# Patient Record
Sex: Male | Born: 1965 | Race: White | Hispanic: No | Marital: Married | Smoking: Never smoker
Health system: Southern US, Community
[De-identification: ages and names within clinical notes are randomized; demographics above are authoritative.]

## PROBLEM LIST (undated history)

## (undated) DIAGNOSIS — G4730 Sleep apnea, unspecified: Secondary | ICD-10-CM

## (undated) DIAGNOSIS — I10 Essential (primary) hypertension: Secondary | ICD-10-CM

## (undated) DIAGNOSIS — M1990 Unspecified osteoarthritis, unspecified site: Secondary | ICD-10-CM

---

## 2003-06-06 HISTORY — PX: BACK SURGERY: SHX140

## 2005-01-13 ENCOUNTER — Ambulatory Visit: Payer: Self-pay | Admitting: Otolaryngology

## 2006-09-17 ENCOUNTER — Emergency Department: Payer: Self-pay | Admitting: General Practice

## 2008-01-19 ENCOUNTER — Emergency Department: Payer: Self-pay | Admitting: Emergency Medicine

## 2010-07-08 ENCOUNTER — Ambulatory Visit: Payer: Self-pay

## 2014-03-16 DIAGNOSIS — E785 Hyperlipidemia, unspecified: Secondary | ICD-10-CM | POA: Insufficient documentation

## 2014-03-16 DIAGNOSIS — G4733 Obstructive sleep apnea (adult) (pediatric): Secondary | ICD-10-CM | POA: Insufficient documentation

## 2014-03-16 DIAGNOSIS — I10 Essential (primary) hypertension: Secondary | ICD-10-CM | POA: Insufficient documentation

## 2015-01-01 ENCOUNTER — Other Ambulatory Visit: Payer: Self-pay | Admitting: Neurosurgery

## 2015-01-01 DIAGNOSIS — M5440 Lumbago with sciatica, unspecified side: Secondary | ICD-10-CM

## 2015-01-13 ENCOUNTER — Other Ambulatory Visit
Admission: AD | Admit: 2015-01-13 | Discharge: 2015-01-13 | Disposition: A | Discharge status: Home / Self Care | Payer: BLUE CROSS/BLUE SHIELD | Source: Ambulatory Visit | Attending: Neurosurgery | Admitting: Neurosurgery

## 2015-01-13 ENCOUNTER — Ambulatory Visit
Admission: RE | Admit: 2015-01-13 | Discharge: 2015-01-13 | Disposition: A | Discharge status: Home / Self Care | Payer: BLUE CROSS/BLUE SHIELD | Source: Ambulatory Visit | Attending: Neurosurgery | Admitting: Neurosurgery

## 2015-01-13 DIAGNOSIS — M5440 Lumbago with sciatica, unspecified side: Secondary | ICD-10-CM | POA: Diagnosis not present

## 2015-01-13 DIAGNOSIS — M4806 Spinal stenosis, lumbar region: Secondary | ICD-10-CM | POA: Insufficient documentation

## 2015-01-13 DIAGNOSIS — M5126 Other intervertebral disc displacement, lumbar region: Secondary | ICD-10-CM | POA: Insufficient documentation

## 2015-01-13 LAB — CREATININE, SERUM
CREATININE: 0.89 mg/dL (ref 0.61–1.24)
GFR calc non Af Amer: >60 mL/min (ref >60)

## 2015-01-13 LAB — BUN: BUN: 13 mg/dL (ref 6–20)

## 2015-01-13 MED ORDER — GADOBENATE DIMEGLUMINE 529 MG/ML IV SOLN
20.0000 mL | Freq: Once | INTRAVENOUS | Status: AC | PRN
  Administered 2015-01-13: 20 mL via INTRAVENOUS

## 2015-06-22 ENCOUNTER — Emergency Department
Admission: EM | Admit: 2015-06-22 | Discharge: 2015-06-22 | Disposition: A | Discharge status: Home / Self Care | Payer: BLUE CROSS/BLUE SHIELD | Attending: Emergency Medicine | Admitting: Emergency Medicine

## 2015-06-22 ENCOUNTER — Encounter: Payer: Self-pay | Admitting: Emergency Medicine

## 2015-06-22 ENCOUNTER — Emergency Department: Payer: BLUE CROSS/BLUE SHIELD

## 2015-06-22 DIAGNOSIS — R0789 Other chest pain: Secondary | ICD-10-CM | POA: Insufficient documentation

## 2015-06-22 DIAGNOSIS — Z79899 Other long term (current) drug therapy: Secondary | ICD-10-CM | POA: Diagnosis not present

## 2015-06-22 DIAGNOSIS — E876 Hypokalemia: Secondary | ICD-10-CM | POA: Diagnosis not present

## 2015-06-22 DIAGNOSIS — R2243 Localized swelling, mass and lump, lower limb, bilateral: Secondary | ICD-10-CM | POA: Insufficient documentation

## 2015-06-22 DIAGNOSIS — R079 Chest pain, unspecified: Secondary | ICD-10-CM | POA: Diagnosis present

## 2015-06-22 DIAGNOSIS — I10 Essential (primary) hypertension: Secondary | ICD-10-CM | POA: Diagnosis not present

## 2015-06-22 HISTORY — DX: Essential (primary) hypertension: I10

## 2015-06-22 LAB — CBC WITH DIFFERENTIAL/PLATELET
Basophils Absolute: 0 10*3/uL (ref 0–0.1)
Basophils Relative: 1 %
Eosinophils Absolute: 0.1 10*3/uL (ref 0–0.7)
Eosinophils Relative: 3 %
HCT: 38.3 % — ABNORMAL LOW (ref 40.0–52.0)
HEMOGLOBIN: 13.2 g/dL (ref 13.0–18.0)
LYMPHS ABS: 1.7 10*3/uL (ref 1.0–3.6)
LYMPHS PCT: 33 %
MCH: 27.8 pg (ref 26.0–34.0)
MCHC: 34.4 g/dL (ref 32.0–36.0)
MCV: 80.8 fL (ref 80.0–100.0)
MONOS PCT: 8 %
Monocytes Absolute: 0.4 10*3/uL (ref 0.2–1.0)
NEUTROS PCT: 55 %
Neutro Abs: 2.9 10*3/uL (ref 1.4–6.5)
Platelets: 216 10*3/uL (ref 150–440)
RBC: 4.73 MIL/uL (ref 4.40–5.90)
RDW: 13.3 % (ref 11.5–14.5)
WBC: 5.1 10*3/uL (ref 3.8–10.6)

## 2015-06-22 LAB — COMPREHENSIVE METABOLIC PANEL
ALK PHOS: 77 U/L (ref 38–126)
ALT: 24 U/L (ref 17–63)
AST: 19 U/L (ref 15–41)
Albumin: 3.8 g/dL (ref 3.5–5.0)
Anion gap: 7 (ref 5–15)
BILIRUBIN TOTAL: 0.6 mg/dL (ref 0.3–1.2)
BUN: 15 mg/dL (ref 6–20)
CALCIUM: 8.2 mg/dL — AB (ref 8.9–10.3)
CO2: 30 mmol/L (ref 22–32)
CREATININE: 0.81 mg/dL (ref 0.61–1.24)
Chloride: 100 mmol/L — ABNORMAL LOW (ref 101–111)
Glucose, Bld: 124 mg/dL — ABNORMAL HIGH (ref 65–99)
Potassium: 2.8 mmol/L — CL (ref 3.5–5.1)
Sodium: 137 mmol/L (ref 135–145)
TOTAL PROTEIN: 6.8 g/dL (ref 6.5–8.1)

## 2015-06-22 LAB — TROPONIN I

## 2015-06-22 MED ORDER — POTASSIUM CHLORIDE ER 10 MEQ PO TBCR: 40.0000 meq | EXTENDED_RELEASE_TABLET | Freq: Every day | ORAL | Status: DC

## 2015-06-22 MED ORDER — DIAZEPAM 5 MG/ML IJ SOLN
5.0000 mg | Freq: Once | INTRAMUSCULAR | Status: AC
  Administered 2015-06-22: 5 mg via INTRAVENOUS
  Filled 2015-06-22: qty 2

## 2015-06-22 MED ORDER — KETOROLAC TROMETHAMINE 30 MG/ML IJ SOLN
30.0000 mg | Freq: Once | INTRAMUSCULAR | Status: AC
  Administered 2015-06-22: 30 mg via INTRAVENOUS
  Filled 2015-06-22: qty 1

## 2015-06-22 MED ORDER — CARISOPRODOL 350 MG PO TABS: 350.0000 mg | ORAL_TABLET | Freq: Three times a day (TID) | ORAL | Status: DC | PRN

## 2015-06-22 MED ORDER — POTASSIUM CHLORIDE CRYS ER 20 MEQ PO TBCR
40.0000 meq | EXTENDED_RELEASE_TABLET | Freq: Once | ORAL | Status: AC
  Administered 2015-06-22: 40 meq via ORAL
  Filled 2015-06-22: qty 2

## 2015-06-22 NOTE — ED Notes (Signed)
Patient ambulatory to triage with steady gait, without difficulty or distress noted; pt reports out of BP meds x week; pt st upper chest pain radiating around into back for last few days; st "I think I pulled a muscle, I do a lot of lifting"; denies any accomp symptoms; st pain increases with movement

## 2015-06-22 NOTE — ED Notes (Signed)
Pt observed lying in bed - MD Schaevitz is assessing him at this time   Report received

## 2015-06-22 NOTE — ED Notes (Signed)
meds administered as ordered   BP elevated   Spoke with him and his spouse about a potassium rich diet -

## 2015-06-22 NOTE — Discharge Instructions (Signed)

## 2015-06-22 NOTE — ED Provider Notes (Addendum)
[facility] Emergency Department Provider Note  ____________________________________________  Time seen: Approximately 7:05 AM  I have reviewed the triage vital signs and the nursing notes.   HISTORY  Chief Complaint No chief complaint on file.    HPI Jack Campbell. is a 50 y.o. male with a history of high blood pressure who is presenting today with chest pain to the mid sternum and right side of his chest for the past week. He says that the pain feels like a "soreness" and worsens when he moves. He says that he has been using ibuprofen with minimal relief. 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 He says that he also has pain just medial to his right scapula that feels like a pinching when he moves his right shoulder. He says that when he moves his right shoulder and this pinching happens it "takes his breath away." He denies any nausea or vomiting. Denies any diaphoresis. No history of heart attack in his family. Has been out of his blood pressure medications for one week but says that he has refills ready for him at his pharmacy.   Past Medical History  Diagnosis Date  . Hypertension     There are no active problems to display for this patient.   Past Surgical History  Procedure Laterality Date  . Back surgery      Current Outpatient Rx  Name  Route  Sig  Dispense  Refill  . atenolol (TENORMIN) 25 MG tablet   Oral   Take by mouth daily.           Allergies Review of patient's allergies indicates no known allergies.  No family history on file.  Social History Social History  Substance Use Topics  . Smoking status: Never Smoker   . Smokeless tobacco: None  . Alcohol Use: No    Review of Systems Constitutional: No fever/chills Eyes: No visual changes. ENT: No sore throat. Cardiovascular: As above  Respiratory: As above Gastrointestinal: No abdominal pain.  No nausea, no vomiting.  No diarrhea.   No constipation. Genitourinary: Negative for dysuria. Musculoskeletal: Negative for back pain. Skin: Negative for rash. Neurological: Negative for headaches, focal weakness or numbness.  10-point ROS otherwise negative.  ____________________________________________   PHYSICAL EXAM:  VITAL SIGNS: ED Triage Vitals  Enc Vitals Group     BP 06/22/15 0558 191/103 mmHg     Pulse Rate 06/22/15 0558 87     Resp 06/22/15 0558 20     Temp 06/22/15 0558 98 F (36.7 C)     Temp Source 06/22/15 0558 Oral     SpO2 06/22/15 0558 99 %     Weight 06/22/15 0558 310 lb (140.615 kg)     Height 06/22/15 0558  (1.803 m)     Head Cir --      Peak Flow --      Pain Score 06/22/15 0558 8     Pain Loc --      Pain Edu? --      Excl. in GC? --     Constitutional: Alert and oriented. Well appearing and in no acute distress. Eyes: Conjunctivae are normal. PERRL. EOMI. Head: Atraumatic. Nose: No congestion/rhinnorhea. Mouth/Throat: Mucous membranes are moist.  Oropharynx non-erythematous. Neck: No stridor.   Cardiovascular: Normal rate, regular rhythm. Grossly normal heart sounds.  Good peripheral circulation with equal and bilateral radial as well as dorsalis pedis pulses. Chest pain is reproducible to  palpation especially over the sternum and the right side of the chest. There is minimal tenderness just medial to the right scapula. Respiratory: Normal respiratory effort.  No retractions. Lungs CTAB. Gastrointestinal: Soft and nontender. No distention. No abdominal bruits. No CVA tenderness. Musculoskeletal: Mild bilateral and equal pitting edema to the bilateral ankles. The patient says that his ankles appear normal to him.  No joint effusions. Neurologic:  Normal speech and language. No gross focal neurologic deficits are appreciated. No gait instability. Skin:  Skin is warm, dry and intact. No rash noted. Psychiatric: Mood and affect are normal. Speech and behavior are  normal.  ____________________________________________   LABS (all labs ordered are listed, but only abnormal results are displayed)  Labs Reviewed  CBC WITH DIFFERENTIAL/PLATELET - Abnormal; Notable for the following:    HCT 38.3 (*)    All other components within normal limits  COMPREHENSIVE METABOLIC PANEL - Abnormal; Notable for the following:    Potassium 2.8 (*)    Chloride 100 (*)    Glucose, Bld 124 (*)    Calcium 8.2 (*)    All other components within normal limits  TROPONIN I   ____________________________________________  EKG  ED ECG REPORT I, Arelia Longest, the attending physician, personally viewed and interpreted this ECG.   Date: 06/22/2015  EKG Time: 604  Rate: 74  Rhythm: normal EKG, normal sinus rhythm  Axis: Normal axis  Intervals:none  ST&T Change: No ST segment elevation or depression. No abnormal T-wave inversion.  ____________________________________________  RADIOLOGY  No active cardiopulmonary disease ____________________________________________   PROCEDURES   ____________________________________________   INITIAL IMPRESSION / ASSESSMENT AND PLAN / ED COURSE  Pertinent labs & imaging results that were available during my care of the patient were reviewed by me and considered in my medical decision making (see chart for details).  Patient with history and physical consistent with musculoskeletal chest pain. Patient's lower extremity edema likely related to obesity.PERC negative.    ----------------------------------------- 8:43 AM on 06/22/2015 -----------------------------------------  Patient resting comfortably now with increased mobility because of reduced pain after medications. He says his pain is now a 3-4 out of 10. We discussed further treatment including muscle cream such as Aspercreme, icy hot or BenGay. He will continue using ibuprofen and I will also give him a prescription for Soma as well as 40 mEq were potassium  to take before bed tonight. I will also give him a work note as we stressed the importance of rest to improve his muscle strain. He knows to follow up with his primary care doctor within 7 days. ________ he will also be picking up his blood pressure medication a [facility] this afternoon. ____________________________________   FINAL CLINICAL IMPRESSION(S) / ED DIAGNOSES  Musculoskeletal chest pain. Hypokalemia.      Myrna Blazer, MD 06/22/15 1610  Myrna Blazer, MD 06/22/15 [phone]

## 2015-06-22 NOTE — ED Notes (Signed)
Pt presents to ED with c/o mid chest pain and back pain x 1 week. Pt states "I think I just pulled a muscle, I lift heavy boxes at my job." Pt also states has been out of bp medicine x 1 week. Pt reports increase pain and shortness of breath with movement. Pt denies dizziness, headache, abdominal pain or other complaints at this time. Pt alert and oriented x 4, no increased work in breathing noted, skin warm and dry. Spouse at bedside. Pt placed on cardiac monitor.

## 2015-11-23 ENCOUNTER — Encounter
Admission: RE | Admit: 2015-11-23 | Discharge: 2015-11-23 | Disposition: A | Discharge status: Home / Self Care | Payer: BLUE CROSS/BLUE SHIELD | Source: Ambulatory Visit | Attending: Orthopedic Surgery | Admitting: Orthopedic Surgery

## 2015-11-23 DIAGNOSIS — G5601 Carpal tunnel syndrome, right upper limb: Secondary | ICD-10-CM | POA: Diagnosis present

## 2015-11-23 DIAGNOSIS — G4730 Sleep apnea, unspecified: Secondary | ICD-10-CM | POA: Diagnosis not present

## 2015-11-23 DIAGNOSIS — Z01812 Encounter for preprocedural laboratory examination: Secondary | ICD-10-CM | POA: Insufficient documentation

## 2015-11-23 DIAGNOSIS — I10 Essential (primary) hypertension: Secondary | ICD-10-CM | POA: Insufficient documentation

## 2015-11-23 HISTORY — DX: Sleep apnea, unspecified: G47.30

## 2015-11-23 HISTORY — DX: Unspecified osteoarthritis, unspecified site: M19.90

## 2015-11-23 LAB — CBC
HEMATOCRIT: 39.4 % — AB (ref 40.0–52.0)
HEMOGLOBIN: 13.8 g/dL (ref 13.0–18.0)
MCH: 28.4 pg (ref 26.0–34.0)
MCHC: 35.1 g/dL (ref 32.0–36.0)
MCV: 80.9 fL (ref 80.0–100.0)
Platelets: 237 10*3/uL (ref 150–440)
RBC: 4.87 MIL/uL (ref 4.40–5.90)
RDW: 13.3 % (ref 11.5–14.5)
WBC: 7.1 10*3/uL (ref 3.8–10.6)

## 2015-11-23 LAB — POTASSIUM: Potassium: 2.8 mmol/L — CL (ref 3.5–5.1)

## 2015-11-23 NOTE — Patient Instructions (Signed)
  Your procedure is scheduled on: December 01, 2015  (Wednesday) Report to Day Surgery. [facility] To find out your arrival time please call [phone] between 1PM - 3PM on November 30, 2015 (Tuesday).  Remember: Instructions that are not followed completely may result in serious medical risk, up to and including death, or upon the discretion of your surgeon and anesthesiologist your surgery may need to be rescheduled.    _x___ 1. Do not eat food or drink liquids after midnight. No gum chewing or hard candies.     _x___ 2. No Alcohol for 24 hours before or after surgery.   _x___ 3. Do Not Smoke For 24 Hours Prior to Your Surgery.   ____ 4. Bring all medications with you on the day of surgery if instructed.    _x___ 5. Notify your doctor if there is any change in your medical condition     (cold, fever, infections).       Do not wear jewelry, make-up, hairpins, clips or nail polish.  Do not wear lotions, powders, or perfumes. You may wear deodorant.  Do not shave 48 hours prior to surgery. Men may shave face and neck.  Do not bring valuables to the hospital.    [facility] is not responsible for any belongings or valuables.               Contacts, dentures or bridgework may not be worn into surgery.  Leave your suitcase in the car. After surgery it may be brought to your room.  For patients admitted to the hospital, discharge time is determined by your                treatment team.   Patients discharged the day of surgery will not be allowed to drive home.   Please read over the following fact sheets that you were given:   Surgical Site Infection Prevention   ____ Take these medicines the morning of surgery with A SIP OF WATER:    1.   2.   3.   4.  5.  6.  ____ Fleet Enema (as directed)   _x__ Use CHG Soap as directed  ____ Use inhalers on the day of surgery  ____ Stop metformin 2 days prior to surgery    ____ Take 1/2 of usual insulin dose the night  before surgery and none on the morning of surgery.   __x__ Stop Coumadin/Plavix/aspirin on (NO ASPIRIN)  __x__ Stop Anti-inflammatories on (NO NSAIDS) STOP IBUPROFEN NOW ,   TYLENOL OK TO TAKE FOR PAIN IF NEEDED   _x__ Stop supplements until after surgery.  (STOP OSTEO BI-FLEX NOW)  _x__ Bring C-Pap to the hospital.

## 2015-11-23 NOTE — Pre-Procedure Instructions (Signed)
K+ results faxed and called to Dr. Ernest PineHooten office (spoke to [place]).

## 2015-11-24 NOTE — Pre-Procedure Instructions (Signed)
REQUEST FOR CLEARANCE /Kt 2.8,AS INSTRUCTED BY DR P CARROLL CALLED AND FAXED TO [facility] AT DR [facility]

## 2015-11-30 NOTE — Pre-Procedure Instructions (Signed)
Kt repeated at pcp office 11/25/15 and 3.1. Supplement started 11/29/15 per pcp order. Spoke with elaine at dr hooten's since surgery 12/01/15

## 2015-12-01 ENCOUNTER — Ambulatory Visit: Payer: BLUE CROSS/BLUE SHIELD | Admitting: Anesthesiology

## 2015-12-01 ENCOUNTER — Encounter: Payer: Self-pay | Admitting: *Deleted

## 2015-12-01 ENCOUNTER — Ambulatory Visit
Admission: RE | Admit: 2015-12-01 | Discharge: 2015-12-01 | Disposition: A | Discharge status: Home / Self Care | Payer: BLUE CROSS/BLUE SHIELD | Source: Ambulatory Visit | Attending: Orthopedic Surgery | Admitting: Orthopedic Surgery

## 2015-12-01 ENCOUNTER — Encounter
Admission: RE | Disposition: A | Discharge status: Home / Self Care | Payer: Self-pay | Source: Ambulatory Visit | Attending: Orthopedic Surgery

## 2015-12-01 DIAGNOSIS — G4730 Sleep apnea, unspecified: Secondary | ICD-10-CM | POA: Diagnosis not present

## 2015-12-01 DIAGNOSIS — E669 Obesity, unspecified: Secondary | ICD-10-CM | POA: Insufficient documentation

## 2015-12-01 DIAGNOSIS — I10 Essential (primary) hypertension: Secondary | ICD-10-CM | POA: Diagnosis not present

## 2015-12-01 DIAGNOSIS — G5601 Carpal tunnel syndrome, right upper limb: Secondary | ICD-10-CM | POA: Diagnosis not present

## 2015-12-01 DIAGNOSIS — M1990 Unspecified osteoarthritis, unspecified site: Secondary | ICD-10-CM | POA: Diagnosis not present

## 2015-12-01 HISTORY — PX: CARPAL TUNNEL RELEASE: SHX101

## 2015-12-01 LAB — POCT I-STAT 4, (NA,K, GLUC, HGB,HCT)
GLUCOSE: 94 mg/dL (ref 65–99)
HEMATOCRIT: 39 % (ref 39.0–52.0)
Hemoglobin: 13.3 g/dL (ref 13.0–17.0)
Potassium: 3.7 mmol/L (ref 3.5–5.1)
SODIUM: 143 mmol/L (ref 135–145)

## 2015-12-01 SURGERY — CARPAL TUNNEL RELEASE
Anesthesia: General | Site: Wrist | Laterality: Right | Wound class: Clean

## 2015-12-01 MED ORDER — MIDAZOLAM HCL 2 MG/2ML IJ SOLN
INTRAMUSCULAR | Status: DC | PRN
  Administered 2015-12-01: 2 mg via INTRAVENOUS

## 2015-12-01 MED ORDER — ONDANSETRON HCL 4 MG/2ML IJ SOLN
INTRAMUSCULAR | Status: DC | PRN
  Administered 2015-12-01: 4 mg via INTRAVENOUS

## 2015-12-01 MED ORDER — LIDOCAINE HCL (CARDIAC) 20 MG/ML IV SOLN
INTRAVENOUS | Status: DC | PRN
  Administered 2015-12-01: 100 mg via INTRAVENOUS

## 2015-12-01 MED ORDER — HYDROCODONE-ACETAMINOPHEN 5-325 MG PO TABS: 1.0000 | ORAL_TABLET | ORAL | Status: DC | PRN

## 2015-12-01 MED ORDER — FENTANYL CITRATE (PF) 100 MCG/2ML IJ SOLN
INTRAMUSCULAR | Status: DC | PRN
  Administered 2015-12-01: 50 ug via INTRAVENOUS

## 2015-12-01 MED ORDER — ACETAMINOPHEN 10 MG/ML IV SOLN
INTRAVENOUS | Status: DC | PRN
  Administered 2015-12-01: 1000 mg via INTRAVENOUS

## 2015-12-01 MED ORDER — GLYCOPYRROLATE 0.2 MG/ML IJ SOLN
INTRAMUSCULAR | Status: DC | PRN
  Administered 2015-12-01: 0.2 mg via INTRAVENOUS

## 2015-12-01 MED ORDER — BUPIVACAINE HCL (PF) 0.25 % IJ SOLN
INTRAMUSCULAR | Status: AC
  Filled 2015-12-01: qty 30

## 2015-12-01 MED ORDER — BUPIVACAINE HCL (PF) 0.25 % IJ SOLN
INTRAMUSCULAR | Status: DC | PRN
  Administered 2015-12-01: 10 mL

## 2015-12-01 MED ORDER — KETOROLAC TROMETHAMINE 30 MG/ML IJ SOLN
INTRAMUSCULAR | Status: DC | PRN
  Administered 2015-12-01: 30 mg via INTRAVENOUS

## 2015-12-01 MED ORDER — ONDANSETRON HCL 4 MG/2ML IJ SOLN: 4.0000 mg | Freq: Once | INTRAMUSCULAR | Status: DC | PRN

## 2015-12-01 MED ORDER — FENTANYL CITRATE (PF) 100 MCG/2ML IJ SOLN
INTRAMUSCULAR | Status: AC
  Filled 2015-12-01: qty 2

## 2015-12-01 MED ORDER — ONDANSETRON HCL 4 MG PO TABS: 4.0000 mg | ORAL_TABLET | Freq: Four times a day (QID) | ORAL | Status: DC | PRN

## 2015-12-01 MED ORDER — METOCLOPRAMIDE HCL 5 MG/ML IJ SOLN: 5.0000 mg | Freq: Three times a day (TID) | INTRAMUSCULAR | Status: DC | PRN

## 2015-12-01 MED ORDER — NEOMYCIN-POLYMYXIN B GU 40-200000 IR SOLN
Status: AC
Start: 2015-12-01 — End: 2015-12-01
  Filled 2015-12-01: qty 2

## 2015-12-01 MED ORDER — LACTATED RINGERS IV SOLN
INTRAVENOUS | Status: DC
  Administered 2015-12-01: 14:00:00 via INTRAVENOUS

## 2015-12-01 MED ORDER — SODIUM CHLORIDE 0.9 % IV SOLN: INTRAVENOUS | Status: DC

## 2015-12-01 MED ORDER — ACETAMINOPHEN 10 MG/ML IV SOLN
INTRAVENOUS | Status: AC
  Filled 2015-12-01: qty 100

## 2015-12-01 MED ORDER — FAMOTIDINE 20 MG PO TABS
20.0000 mg | ORAL_TABLET | Freq: Once | ORAL | Status: AC
  Administered 2015-12-01: 20 mg via ORAL

## 2015-12-01 MED ORDER — FENTANYL CITRATE (PF) 100 MCG/2ML IJ SOLN
25.0000 ug | INTRAMUSCULAR | Status: DC | PRN
  Administered 2015-12-01 (×4): 25 ug via INTRAVENOUS

## 2015-12-01 MED ORDER — ONDANSETRON HCL 4 MG/2ML IJ SOLN: 4.0000 mg | Freq: Four times a day (QID) | INTRAMUSCULAR | Status: DC | PRN

## 2015-12-01 MED ORDER — METOCLOPRAMIDE HCL 10 MG PO TABS: 5.0000 mg | ORAL_TABLET | Freq: Three times a day (TID) | ORAL | Status: DC | PRN

## 2015-12-01 MED ORDER — NEOMYCIN-POLYMYXIN B GU 40-200000 IR SOLN
Status: DC | PRN
  Administered 2015-12-01: 2 mL

## 2015-12-01 MED ORDER — FAMOTIDINE 20 MG PO TABS
ORAL_TABLET | ORAL | Status: AC
  Administered 2015-12-01: 20 mg via ORAL
  Filled 2015-12-01: qty 1

## 2015-12-01 MED ORDER — PROPOFOL 10 MG/ML IV BOLUS
INTRAVENOUS | Status: DC | PRN
  Administered 2015-12-01: 100 mg via INTRAVENOUS
  Administered 2015-12-01: 200 mg via INTRAVENOUS
  Administered 2015-12-01: 100 mg via INTRAVENOUS

## 2015-12-01 SURGICAL SUPPLY — 26 items
BANDAGE ELASTIC 3 LF NS (GAUZE/BANDAGES/DRESSINGS) ×3 IMPLANT
BLADE SURG 15 STRL LF DISP TIS (BLADE) ×1 IMPLANT
BLADE SURG 15 STRL SS (BLADE) ×2
BNDG ESMARK 4X12 TAN STRL LF (GAUZE/BANDAGES/DRESSINGS) ×3 IMPLANT
CANISTER SUCT 1200ML W/VALVE (MISCELLANEOUS) ×3 IMPLANT
CAST PADDING 3X4FT ST 30246 (SOFTGOODS) ×2
CUFF TOURN 18 STER (MISCELLANEOUS) ×3 IMPLANT
DRSG DERMACEA 8X12 NADH (GAUZE/BANDAGES/DRESSINGS) ×3 IMPLANT
DURAPREP 26ML APPLICATOR (WOUND CARE) ×3 IMPLANT
ELECT CAUTERY BLADE 6.4 (BLADE) ×3 IMPLANT
ELECT REM PT RETURN 9FT ADLT (ELECTROSURGICAL) ×3
ELECTRODE REM PT RTRN 9FT ADLT (ELECTROSURGICAL) ×1 IMPLANT
GAUZE SPONGE 4X4 12PLY STRL (GAUZE/BANDAGES/DRESSINGS) ×3 IMPLANT
GLOVE BIOGEL M STRL SZ7.5 (GLOVE) ×3 IMPLANT
GLOVE INDICATOR 8.0 STRL GRN (GLOVE) ×3 IMPLANT
GOWN STRL REUS W/ TWL LRG LVL3 (GOWN DISPOSABLE) ×2 IMPLANT
GOWN STRL REUS W/TWL LRG LVL3 (GOWN DISPOSABLE) ×4
KIT RM TURNOVER STRD PROC AR (KITS) ×3 IMPLANT
NS IRRIG 500ML POUR BTL (IV SOLUTION) ×3 IMPLANT
PACK EXTREMITY ARMC (MISCELLANEOUS) ×3 IMPLANT
PAD CAST CTTN 3X4 STRL (SOFTGOODS) ×1 IMPLANT
SOL PREP PVP 2OZ (MISCELLANEOUS) ×3
SOLUTION PREP PVP 2OZ (MISCELLANEOUS) ×1 IMPLANT
SPLINT CAST 1 STEP 3X12 (MISCELLANEOUS) ×3 IMPLANT
STOCKINETTE STRL 4IN 9604848 (GAUZE/BANDAGES/DRESSINGS) ×3 IMPLANT
SUT ETHILON 5-0 FS-2 18 BLK (SUTURE) ×3 IMPLANT

## 2015-12-01 NOTE — Op Note (Signed)
OPERATIVE NOTE  DATE OF SURGERY:  12/01/2015  PATIENT NAME:  Stacy GardnerWilliam E Slappey Jr.   DOB: 05/04/1966  MRN: [id number]  PRE-OPERATIVE DIAGNOSIS: Right carpal tunnel syndrome  POST-OPERATIVE DIAGNOSIS:  Same  PROCEDURE:  Right carpal tunnel release  SURGEON:  Jena GaussJames P Deja Pisarski, Jr. M.D.  ANESTHESIA: general  ESTIMATED BLOOD LOSS: Minimal  FLUIDS REPLACED: 700 mL of crystalloid  TOURNIQUET TIME: 32 minutes  DRAINS: None  INDICATIONS FOR SURGERY: Stacy GardnerWilliam E Hawn Jr. is a 50 y.o. year old male with a long history of numbness and paresthesias to the right hand. EMG/nerve conduction studies demonstrated findings consistent with carpal tunnel syndrome.The patient had not seen any significant improvement despite conservative nonsurgical intervention. After discussion of the risks and benefits of surgical intervention, the patient expressed understanding of the risks benefits and agree with plans for carpal tunnel release.   PROCEDURE IN DETAIL: The patient was brought into the operating room and after adequate general anesthesia, a tourniquet was placed on the patient's right upper arm.The right hand and arm were prepped with alcohol and Duraprep and draped in the usual sterile fashion. A "time-out" was performed as per usual protocol. The hand and forearm were exsanguinated using an Esmarch and the tourniquet was inflated to 250 mmHg. Loupe magnification was used throughout the procedure. An incision was made just ulnar to the thenar palmar crease. Dissection was carried down through the palmar fascia to the transverse carpal ligament. The transverse carpal ligament was sharply incised, taking care to protect the underlying structures with the carpal tunnel. Complete release of the transverse carpal ligament was achieved. There was no evidence of ganglion cyst or lipoma within the carpal tunnel. The wound was irrigated with copious amounts of normal saline with antibiotic solution. The skin was then  re-approximated with interrupted sutures of #5-0 nylon. A sterile dressing was applied followed by application of a volar splint. The tourniquet was deflated with a total tourniquet time of 32 minutes.  The patient tolerated the procedure well and was transported to the PACU in stable condition.  Laporche Martelle P. Angie FavaHooten, Jr., M.D.

## 2015-12-01 NOTE — Brief Op Note (Signed)
12/01/2015  4:36 PM  PATIENT:  Stacy GardnerWilliam E Greer Jr.  50 y.o. male  PRE-OPERATIVE DIAGNOSIS:  RIGHT CARPAL TUNNEL SYNDROME  POST-OPERATIVE DIAGNOSIS:  RIGHT CARPAL TUNNEL SYNDROME  PROCEDURE:  Procedure(s): CARPAL TUNNEL RELEASE (Right)  SURGEON:  Surgeon(s) and Role:    * Donato HeinzJames P Hooten, MD - Primary  ASSISTANTS: none   ANESTHESIA:   general  EBL:     BLOOD ADMINISTERED:none  DRAINS: none   LOCAL MEDICATIONS USED:  MARCAINE     SPECIMEN:  No Specimen  DISPOSITION OF SPECIMEN:  N/A  COUNTS:  YES  TOURNIQUET:   32 minutes  DICTATION: .Dragon Dictation  PLAN OF CARE: Discharge to home after PACU  PATIENT DISPOSITION:  PACU - hemodynamically stable.   Delay start of Pharmacological VTE agent (>24hrs) due to surgical blood loss or risk of bleeding: not applicable

## 2015-12-01 NOTE — OR Nursing (Signed)
Dr Darleene CleaverVan Staveren notified of bladder scanner reading and pts hx of not being able to void after surgery-pt given the option of catheter or waiting

## 2015-12-01 NOTE — OR Nursing (Signed)
Bladder scanner reading - 350 cc

## 2015-12-01 NOTE — Anesthesia Preprocedure Evaluation (Signed)
Anesthesia Evaluation  Patient identified by MRN, date of birth, ID band Patient awake    Reviewed: Allergy & Precautions, NPO status , Patient's Chart, lab work & pertinent test results, reviewed documented beta blocker date and time   Airway Mallampati: III  TM Distance: >3 FB     Dental  (+) Chipped   Pulmonary sleep apnea and Continuous Positive Airway Pressure Ventilation ,           Cardiovascular hypertension, Pt. on medications and Pt. on home beta blockers      Neuro/Psych    GI/Hepatic   Endo/Other    Renal/GU      Musculoskeletal  (+) Arthritis ,   Abdominal   Peds  Hematology   Anesthesia Other Findings Obese.  Reproductive/Obstetrics                             Anesthesia Physical Anesthesia Plan  ASA: III  Anesthesia Plan: General   Post-op Pain Management:    Induction: Intravenous  Airway Management Planned: Oral ETT and LMA  Additional Equipment:   Intra-op Plan:   Post-operative Plan:   Informed Consent: I have reviewed the patients History and Physical, chart, labs and discussed the procedure including the risks, benefits and alternatives for the proposed anesthesia with the patient or authorized representative who has indicated his/her understanding and acceptance.     Plan Discussed with: CRNA  Anesthesia Plan Comments:         Anesthesia Quick Evaluation

## 2015-12-01 NOTE — H&P (Signed)
The patient has been re-examined, and the chart reviewed, and there have been no interval changes to the documented history and physical.    The risks, benefits, and alternatives have been discussed at length. The patient expressed understanding of the risks benefits and agreed with plans for surgical intervention.  Edward Guthmiller P. Gustavo Dispenza, Jr. M.D.    

## 2015-12-01 NOTE — OR Nursing (Signed)
Patient extremely sleepy, but as soon as he opens his eyes he c/o pain, but immediately goes back to sleep.

## 2015-12-01 NOTE — OR Nursing (Signed)
Fingers on right hand warm to touch and pt moves fingers well

## 2015-12-01 NOTE — Discharge Instructions (Signed)
°  Instructions after Hand / Wrist Surgery ° ° James P. Hooten, Jr., M.D. ° Dept. of Orthopaedics & Sports Medicine ° [facility] ° [address] ° [place], [place]  [address] ° ° Phone: [phone]   Fax: [phone] ° ° °DIET: °• Drink plenty of non-alcoholic fluids & begin a light diet. °• Resume your normal diet the day after surgery. ° °ACTIVITY:  °• Keep the hand elevated above the level of the elbow. °• Begin gently moving the fingers on a regular basis to avoid stiffness. °• Avoid any heavy lifting, pushing, or pulling with the operative hand. °• Do not drive or operate any equipment until instructed. ° °WOUND CARE:  °• Keep the splint/bandage clean and dry.  °• The splint and stitches will be removed in the office. °• Continue to use the ice packs periodically to reduce pain and swelling. °• You may bathe or shower after the stitches are removed at the first office visit following surgery. ° °MEDICATIONS: °• You may resume your regular medications. °• Please take the pain medication as prescribed. °• Do not take pain medication on an empty stomach. °• Do not drive or drink alcoholic beverages when taking pain medications. ° °CALL THE OFFICE FOR: °• Temperature above 101 degrees °• Excessive bleeding or drainage on the dressing. °• Excessive swelling, coldness, or paleness of the fingers. °• Persistent nausea and vomiting. ° °FOLLOW-UP:  °• You should have an appointment to return to the office in 7-10 days after surgery.  ° °REMEMBER: R.I.C.E. = Rest, Ice, Compression, Elevation !  ° ° ° °AMBULATORY SURGERY  °DISCHARGE INSTRUCTIONS ° ° °1) The drugs that you were given will stay in your system until tomorrow so for the next 24 hours you should not: ° °A) Drive an automobile °B) Make any legal decisions °C) Drink any alcoholic beverage ° ° °2) You may resume regular meals tomorrow.  Today it is better to start with liquids and gradually work up to solid foods. ° °You may eat anything you prefer, but  it is better to start with liquids, then soup and crackers, and gradually work up to solid foods. ° ° °3) Please notify your doctor immediately if you have any unusual bleeding, trouble breathing, redness and pain at the surgery site, drainage, fever, or pain not relieved by medication. ° ° ° °4) Additional Instructions: ° ° ° ° ° ° ° °Please contact your physician with any problems or Same Day Surgery at [phone], Monday through Friday 6 am to 4 pm, or [place] [facility] number at [phone]. °

## 2015-12-01 NOTE — Anesthesia Procedure Notes (Signed)
Procedure Name: LMA Insertion Date/Time: 12/01/2015 3:23 PM Performed by: Stormy FabianURTIS, Sharalyn Lomba Pre-anesthesia Checklist: Patient identified, Patient being monitored, Timeout performed, Emergency Drugs available and Suction available Patient Re-evaluated:Patient Re-evaluated prior to inductionOxygen Delivery Method: Circle system utilized Preoxygenation: Pre-oxygenation with 100% oxygen Intubation Type: IV induction Ventilation: Mask ventilation without difficulty LMA: LMA inserted LMA Size: 4.5 Tube type: Oral Number of attempts: 1 Placement Confirmation: positive ETCO2 and breath sounds checked- equal and bilateral Tube secured with: Tape Dental Injury: Teeth and Oropharynx as per pre-operative assessment

## 2015-12-01 NOTE — Transfer of Care (Signed)
Immediate Anesthesia Transfer of Care Note  Patient: Jack GardnerWilliam E Witham Jr.  Procedure(s) Performed: Procedure(s): CARPAL TUNNEL RELEASE (Right)  Patient Location: PACU  Anesthesia Type:General  Level of Consciousness: sedated  Airway & Oxygen Therapy: Patient Spontanous Breathing and Patient connected to face mask oxygen  Post-op Assessment: Report given to RN and Post -op Vital signs reviewed and stable  Post vital signs: Reviewed and stable  Last Vitals:  Filed Vitals:   12/01/15 1342 12/01/15 1634  BP: 180/99 111/65  Pulse: 75 80  Temp: 36.8 C 36.3 C  Resp:  16    Complications: No apparent anesthesia complications

## 2015-12-01 NOTE — Anesthesia Postprocedure Evaluation (Signed)
Anesthesia Post Note  Patient: Jack GardnerWilliam E Teagarden Jr.  Procedure(s) Performed: Procedure(s) (LRB): CARPAL TUNNEL RELEASE (Right)  Patient location during evaluation: PACU Anesthesia Type: General Level of consciousness: awake Pain management: satisfactory to patient Vital Signs Assessment: post-procedure vital signs reviewed and stable Respiratory status: nonlabored ventilation Cardiovascular status: stable Anesthetic complications: no    Last Vitals:  Filed Vitals:   12/01/15 1704 12/01/15 1719  BP: 128/75 124/74  Pulse: 63 60  Temp:  36.6 C  Resp: 12 10    Last Pain:  Filed Vitals:   12/01/15 1721  PainSc: 3                  VAN STAVEREN,Mana Haberl

## 2015-12-01 NOTE — OR Nursing (Signed)
Up to BR unable to void

## 2015-12-02 ENCOUNTER — Encounter: Payer: Self-pay | Admitting: Orthopedic Surgery

## 2016-02-14 ENCOUNTER — Inpatient Hospital Stay: Admission: RE | Admit: 2016-02-14 | Payer: BLUE CROSS/BLUE SHIELD | Source: Ambulatory Visit

## 2016-03-29 ENCOUNTER — Other Ambulatory Visit: Payer: BLUE CROSS/BLUE SHIELD

## 2016-04-05 ENCOUNTER — Ambulatory Visit: Admit: 2016-04-05 | Payer: BLUE CROSS/BLUE SHIELD | Admitting: Orthopedic Surgery

## 2016-04-05 SURGERY — CARPAL TUNNEL RELEASE
Anesthesia: Choice | Laterality: Left

## 2017-04-20 ENCOUNTER — Other Ambulatory Visit: Payer: Self-pay

## 2017-04-20 ENCOUNTER — Encounter: Payer: Self-pay | Admitting: Emergency Medicine

## 2017-04-20 ENCOUNTER — Emergency Department
Admission: EM | Admit: 2017-04-20 | Discharge: 2017-04-20 | Disposition: A | Discharge status: Home / Self Care | Payer: BLUE CROSS/BLUE SHIELD | Attending: Emergency Medicine | Admitting: Emergency Medicine

## 2017-04-20 ENCOUNTER — Emergency Department: Payer: BLUE CROSS/BLUE SHIELD

## 2017-04-20 DIAGNOSIS — S60221A Contusion of right hand, initial encounter: Secondary | ICD-10-CM | POA: Insufficient documentation

## 2017-04-20 DIAGNOSIS — S6000XA Contusion of unspecified finger without damage to nail, initial encounter: Secondary | ICD-10-CM | POA: Diagnosis not present

## 2017-04-20 DIAGNOSIS — Y92410 Unspecified street and highway as the place of occurrence of the external cause: Secondary | ICD-10-CM | POA: Insufficient documentation

## 2017-04-20 DIAGNOSIS — S63656A Sprain of metacarpophalangeal joint of right little finger, initial encounter: Secondary | ICD-10-CM | POA: Diagnosis not present

## 2017-04-20 DIAGNOSIS — I10 Essential (primary) hypertension: Secondary | ICD-10-CM | POA: Insufficient documentation

## 2017-04-20 DIAGNOSIS — Y999 Unspecified external cause status: Secondary | ICD-10-CM | POA: Insufficient documentation

## 2017-04-20 DIAGNOSIS — Z79899 Other long term (current) drug therapy: Secondary | ICD-10-CM | POA: Diagnosis not present

## 2017-04-20 DIAGNOSIS — Y9389 Activity, other specified: Secondary | ICD-10-CM | POA: Diagnosis not present

## 2017-04-20 DIAGNOSIS — S63654A Sprain of metacarpophalangeal joint of right ring finger, initial encounter: Secondary | ICD-10-CM | POA: Insufficient documentation

## 2017-04-20 DIAGNOSIS — S60921A Unspecified superficial injury of right hand, initial encounter: Secondary | ICD-10-CM | POA: Diagnosis present

## 2017-04-20 MED ORDER — HYDROCODONE-ACETAMINOPHEN 5-325 MG PO TABS
1.0000 | ORAL_TABLET | Freq: Once | ORAL | Status: AC
  Administered 2017-04-20: 1 via ORAL
  Filled 2017-04-20: qty 1

## 2017-04-20 MED ORDER — HYDROCODONE-ACETAMINOPHEN 5-325 MG PO TABS: 1.0000 | ORAL_TABLET | Freq: Four times a day (QID) | ORAL | 0 refills | Status: DC | PRN
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
## 2017-04-20 NOTE — Discharge Instructions (Signed)
Wear the wrist splint and ACE wrap until symptoms improve.  Follow up with Orthopedics if symptoms do not fully resolve.
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
## 2017-10-30 ENCOUNTER — Other Ambulatory Visit: Payer: Self-pay | Admitting: Orthopedic Surgery

## 2017-10-30 DIAGNOSIS — M5416 Radiculopathy, lumbar region: Secondary | ICD-10-CM

## 2017-10-31 ENCOUNTER — Other Ambulatory Visit: Payer: Self-pay | Admitting: Orthopedic Surgery

## 2017-10-31 DIAGNOSIS — M5416 Radiculopathy, lumbar region: Secondary | ICD-10-CM

## 2017-11-02 ENCOUNTER — Encounter (INDEPENDENT_AMBULATORY_CARE_PROVIDER_SITE_OTHER): Payer: Self-pay

## 2017-11-02 ENCOUNTER — Ambulatory Visit
Admission: RE | Admit: 2017-11-02 | Discharge: 2017-11-02 | Disposition: A | Discharge status: Home / Self Care | Payer: BLUE CROSS/BLUE SHIELD | Source: Ambulatory Visit | Attending: Orthopedic Surgery | Admitting: Orthopedic Surgery

## 2017-11-02 DIAGNOSIS — M5416 Radiculopathy, lumbar region: Secondary | ICD-10-CM

## 2017-11-02 DIAGNOSIS — M5127 Other intervertebral disc displacement, lumbosacral region: Secondary | ICD-10-CM | POA: Diagnosis not present

## 2017-11-02 DIAGNOSIS — M5116 Intervertebral disc disorders with radiculopathy, lumbar region: Secondary | ICD-10-CM | POA: Diagnosis not present

## 2017-11-02 DIAGNOSIS — M48061 Spinal stenosis, lumbar region without neurogenic claudication: Secondary | ICD-10-CM | POA: Diagnosis not present

## 2018-06-14 ENCOUNTER — Encounter: Payer: Self-pay | Admitting: Emergency Medicine

## 2018-06-14 ENCOUNTER — Other Ambulatory Visit: Payer: Self-pay

## 2018-06-14 ENCOUNTER — Emergency Department
Admission: EM | Admit: 2018-06-14 | Discharge: 2018-06-14 | Disposition: A | Discharge status: Home / Self Care | Payer: BLUE CROSS/BLUE SHIELD | Attending: Emergency Medicine | Admitting: Emergency Medicine

## 2018-06-14 DIAGNOSIS — M5432 Sciatica, left side: Secondary | ICD-10-CM | POA: Insufficient documentation

## 2018-06-14 DIAGNOSIS — M5431 Sciatica, right side: Secondary | ICD-10-CM | POA: Insufficient documentation

## 2018-06-14 DIAGNOSIS — I10 Essential (primary) hypertension: Secondary | ICD-10-CM | POA: Diagnosis not present

## 2018-06-14 DIAGNOSIS — M5489 Other dorsalgia: Secondary | ICD-10-CM | POA: Diagnosis present

## 2018-06-14 DIAGNOSIS — Z79899 Other long term (current) drug therapy: Secondary | ICD-10-CM | POA: Insufficient documentation

## 2018-06-14 MED ORDER — HYDROMORPHONE HCL 1 MG/ML IJ SOLN
1.0000 mg | Freq: Once | INTRAMUSCULAR | Status: AC
  Administered 2018-06-14: 1 mg via INTRAMUSCULAR
  Filled 2018-06-14: qty 1

## 2018-06-14 MED ORDER — PREDNISONE 20 MG PO TABS
60.0000 mg | ORAL_TABLET | Freq: Once | ORAL | Status: AC
  Administered 2018-06-14: 60 mg via ORAL
  Filled 2018-06-14: qty 3

## 2018-06-14 MED ORDER — OXYCODONE-ACETAMINOPHEN 7.5-325 MG PO TABS: 1.0000 | ORAL_TABLET | Freq: Four times a day (QID) | ORAL | 0 refills | Status: DC | PRN

## 2018-06-14 MED ORDER — CYCLOBENZAPRINE HCL 10 MG PO TABS: 10.0000 mg | ORAL_TABLET | Freq: Three times a day (TID) | ORAL | 0 refills | Status: DC | PRN

## 2018-06-14 MED ORDER — PREDNISONE 10 MG (21) PO TBPK: ORAL_TABLET | Freq: Every day | ORAL | 0 refills | Status: DC

## 2018-06-14 MED ORDER — ORPHENADRINE CITRATE 30 MG/ML IJ SOLN
60.0000 mg | Freq: Two times a day (BID) | INTRAMUSCULAR | Status: DC
  Administered 2018-06-14: 60 mg via INTRAMUSCULAR
  Filled 2018-06-14: qty 2

## 2018-06-14 NOTE — ED Provider Notes (Signed)
[facility] Emergency Department Provider Note   ____________________________________________   First MD Initiated Contact with Patient 06/14/18 1642     (approximate)  I have reviewed the triage vital signs and the nursing notes.   HISTORY  Chief Complaint Back Pain    HPI Jack Campbell. is a 53 y.o. male patient complain of left side back pain that radiates to the left lower extremity.  Patient has a history of sciatica secondary to degenerative disc disease.  Patient  unable to see orthopedics on such short notice.  Patient denies bladder bowel dysfunction.  Patient rates pain 8/10.  It was noted patient blood pressure is elevated.  Patient said he recently restarted blood pressure medications.  No palliative measures for chief complaint.  Past Medical History:  Diagnosis Date  . Arthritis   . Hypertension   . Sleep apnea    Use CPAP    There are no active problems to display for this patient.   Past Surgical History:  Procedure Laterality Date  . BACK SURGERY  2005   Dr. Gerrit Heck, [facility]  . CARPAL TUNNEL RELEASE Right 12/01/2015   Procedure: CARPAL TUNNEL RELEASE;  Surgeon: Donato Heinz, MD;  Location: [facility] ORS;  Service: Orthopedics;  Laterality: Right;    Prior to Admission medications   Medication Sig Start Date End Date Taking? Authorizing Provider  atenolol (TENORMIN) 50 MG tablet Take 50 mg by mouth at bedtime.     [provider]  cyclobenzaprine (FLEXERIL) 10 MG tablet Take 1 tablet (10 mg total) by mouth 3 (three) times daily as needed. 06/14/18   Joni Reining, PA-C  HYDROcodone-acetaminophen (NORCO/VICODIN) 5-325 MG tablet Take 1 tablet every 6 (six) hours as needed by mouth for moderate pain. 04/20/17   Little, Traci M, PA-C  ibuprofen (ADVIL,MOTRIN) 200 MG tablet Take 800 mg by mouth every 6 (six) hours as needed for moderate pain.     [provider]  Misc Natural Products (OSTEO BI-FLEX TRIPLE STRENGTH PO)  Take 1 tablet by mouth 2 (two) times daily.    [provider]  Olmesartan-Amlodipine-HCTZ 40-10-25 MG TABS Take 1 tablet by mouth daily. 06/16/15   [provider]  oxyCODONE-acetaminophen (PERCOCET) 7.5-325 MG tablet Take 1 tablet by mouth every 6 (six) hours as needed. 06/14/18   Joni Reining, PA-C  predniSONE (STERAPRED UNI-PAK 21 TAB) 10 MG (21) TBPK tablet Take by mouth daily. 06/14/18   Joni Reining, PA-C    Allergies Patient has no known allergies.  Family History  Problem Relation Age of Onset  . Hypertension Mother   . Hypertension Father     Social History Social History   Tobacco Use  . Smoking status: Never Smoker  . Smokeless tobacco: Former Engineer, water Use Topics  . Alcohol use: No  . Drug use: No    Review of Systems Constitutional: No fever/chills Eyes: No visual changes. ENT: No sore throat. Cardiovascular: Denies chest pain. Respiratory: Denies shortness of breath. Gastrointestinal: No abdominal pain.  No nausea, no vomiting.  No diarrhea.  No constipation. Genitourinary: Negative for dysuria. Musculoskeletal: Positive for back pain. Skin: Negative for rash. Neurological: Negative for headaches, focal weakness or numbness. Endocrine:Hypertension. ____________________________________________   PHYSICAL EXAM:  VITAL SIGNS: ED Triage Vitals  Enc Vitals Group     BP 06/14/18 1558 (!) 189/93     Pulse Rate 06/14/18 1558 88     Resp 06/14/18 1558 18     Temp  06/14/18 1558 97.8 F (36.6 C)     Temp Source 06/14/18 1558 Oral     SpO2 06/14/18 1558 98 %     Weight 06/14/18 1558 (!) 320 lb (145.2 kg)     Height 06/14/18 1558 5\' 11"  (1.803 m)     Head Circumference --      Peak Flow --      Pain Score 06/14/18 1604 8     Pain Loc --      Pain Edu? --      Excl. in GC? --     Constitutional: Alert and oriented.  Moderate distress.   Cardiovascular: Normal rate, regular rhythm. Grossly normal heart sounds.  Good  peripheral circulation.  Elevated blood pressure. Respiratory: Normal respiratory effort.  No retractions. Lungs CTAB. Gastrointestinal: Soft and nontender. No distention. No abdominal bruits. No CVA tenderness. Musculoskeletal: No obvious deformity.  Patient has moderate guarding palpation L4-S1.  In the supine position the patient has a positive straight leg test with the left lower extremity. Neurologic:  Normal speech and language. No gross focal neurologic deficits are appreciated. No gait instability. Skin:  Skin is warm, dry and intact. No rash noted. Psychiatric: Mood and affect are normal. Speech and behavior are normal.  ____________________________________________   LABS (all labs ordered are listed, but only abnormal results are displayed)  Labs Reviewed - No data to display ____________________________________________  EKG   ____________________________________________  RADIOLOGY  ED MD interpretation:    Official radiology report(s): No results found.  ____________________________________________   PROCEDURES  Procedure(s) performed: None  Procedures  Critical Care performed: No  ____________________________________________   INITIAL IMPRESSION / ASSESSMENT AND PLAN / ED COURSE  As part of my medical decision making, I reviewed the following data within the electronic MEDICAL RECORD NUMBER    Radicular low back pain secondary to degenerative disc of the lumbar spine.  Patient advised to follow orthopedic for definitive treatment.  Take medication as directed.      ____________________________________________   FINAL CLINICAL IMPRESSION(S) / ED DIAGNOSES  Final diagnoses:  Bilateral sciatica     ED Discharge Orders         Ordered    predniSONE (STERAPRED UNI-PAK 21 TAB) 10 MG (21) TBPK tablet  Daily     06/14/18 1648    oxyCODONE-acetaminophen (PERCOCET) 7.5-325 MG tablet  Every 6 hours PRN     06/14/18 1648    cyclobenzaprine (FLEXERIL)  10 MG tablet  3 times daily PRN     06/14/18 1648           Note:  This document was prepared using Dragon voice recognition software and may include unintentional dictation errors.    Joni Reining, PA-C 06/14/18 1653    Sharman Cheek, MD 06/17/18 Marlyne Beards

## 2018-06-14 NOTE — ED Triage Notes (Signed)
Pt arrived via POV with reports of left side back pain radiating down left leg, pt has hx of the same pain last year and received shots which helped with the pain.  Pt ambulatory.  Pt seen PCP recently as well, had been off BP meds, but recently restarted them.  Pt sees Dr. Ernest Pine.

## 2018-06-14 NOTE — ED Notes (Signed)
See triage note  Presents with lower back pain  Pain states pain is moving into left leg  Ambulates well  Denies any recent injury or bowel /bladder problems  Hx of same

## 2018-06-14 NOTE — ED Triage Notes (Signed)
First Nurse Note:  C/O left lower back pain x 2 months.  Has had back surgery in the past and had an injection in back by Dr. Orvil Feil 2 months ago.  Pain persists.   Patient is AAOx3.  Skin warm and dry.  Posture upright and relaxed.  Gait steady.  MAE equally and strong.  NAD

## 2018-08-03 ENCOUNTER — Encounter: Payer: Self-pay | Admitting: Emergency Medicine

## 2018-08-03 ENCOUNTER — Emergency Department
Admission: EM | Admit: 2018-08-03 | Discharge: 2018-08-03 | Disposition: A | Discharge status: Home / Self Care | Payer: BLUE CROSS/BLUE SHIELD | Attending: Emergency Medicine | Admitting: Emergency Medicine

## 2018-08-03 ENCOUNTER — Other Ambulatory Visit: Payer: Self-pay

## 2018-08-03 DIAGNOSIS — K047 Periapical abscess without sinus: Secondary | ICD-10-CM | POA: Insufficient documentation

## 2018-08-03 DIAGNOSIS — Z79899 Other long term (current) drug therapy: Secondary | ICD-10-CM | POA: Diagnosis not present

## 2018-08-03 DIAGNOSIS — K0889 Other specified disorders of teeth and supporting structures: Secondary | ICD-10-CM | POA: Diagnosis present

## 2018-08-03 DIAGNOSIS — I10 Essential (primary) hypertension: Secondary | ICD-10-CM | POA: Diagnosis not present

## 2018-08-03 MED ORDER — CLINDAMYCIN PHOSPHATE 600 MG/4ML IJ SOLN
600.0000 mg | Freq: Once | INTRAMUSCULAR | Status: AC
  Administered 2018-08-03: 600 mg via INTRAMUSCULAR
  Filled 2018-08-03: qty 4

## 2018-08-03 MED ORDER — OXYCODONE-ACETAMINOPHEN 5-325 MG PO TABS
1.0000 | ORAL_TABLET | Freq: Once | ORAL | Status: AC
  Administered 2018-08-03: 1 via ORAL
  Filled 2018-08-03: qty 1

## 2018-08-03 MED ORDER — CLINDAMYCIN HCL 300 MG PO CAPS: 300.0000 mg | ORAL_CAPSULE | Freq: Three times a day (TID) | ORAL | 0 refills | Status: AC

## 2018-08-03 MED ORDER — OXYCODONE-ACETAMINOPHEN 5-325 MG PO TABS: 1.0000 | ORAL_TABLET | ORAL | 0 refills | Status: DC | PRN

## 2018-08-03 MED ORDER — LIDOCAINE VISCOUS HCL 2 % MT SOLN: 10.0000 mL | OROMUCOSAL | 0 refills | Status: DC | PRN

## 2018-08-03 NOTE — ED Provider Notes (Signed)
[facility] Emergency Department Provider Note  ____________________________________________  Time seen: Approximately 2:10 PM  I have reviewed the triage vital signs and the nursing notes.   HISTORY  Chief Complaint Dental Pain    HPI Jack GardnerWilliam E Pelzer Jr. is a 53 y.o. male that presents emergency department for evaluation of left-sided dental pain for 6 days.  Patient is being seen by his dentist for a dental abscess.  He was started on amoxicillin on Tuesday.  He states that he continues to have drainage and pain to this tooth.  He can feel a knot next to the tooth.  Swelling is similar to what it has been this week.  He was taking ibuprofen for pain but discontinued this medication because he was concerned that ibuprofen may be elevating his blood pressure.  No fevers.   Past Medical History:  Diagnosis Date  . Arthritis   . Hypertension   . Sleep apnea    Use CPAP    There are no active problems to display for this patient.   Past Surgical History:  Procedure Laterality Date  . BACK SURGERY  2005   [facility], [facility]  . CARPAL TUNNEL RELEASE Right 12/01/2015   Procedure: CARPAL TUNNEL RELEASE;  Surgeon: Donato HeinzJames P Hooten, MD;  Location: [facility] ORS;  Service: Orthopedics;  Laterality: Right;    Prior to Admission medications   Medication Sig Start Date End Date Taking? Authorizing Provider  atenolol (TENORMIN) 50 MG tablet Take 50 mg by mouth at bedtime.     [provider]  clindamycin (CLEOCIN) 300 MG capsule Take 1 capsule (300 mg total) by mouth 3 (three) times daily for 10 days. 08/03/18 08/13/18  Enid DerryWagner, Tayja Manzer, PA-C  cyclobenzaprine (FLEXERIL) 10 MG tablet Take 1 tablet (10 mg total) by mouth 3 (three) times daily as needed. 06/14/18   Joni ReiningSmith, Ronald K, PA-C  HYDROcodone-acetaminophen (NORCO/VICODIN) 5-325 MG tablet Take 1 tablet every 6 (six) hours as needed by mouth for moderate pain. 04/20/17   Little, Traci M, PA-C  ibuprofen (ADVIL,MOTRIN)  200 MG tablet Take 800 mg by mouth every 6 (six) hours as needed for moderate pain.     [provider]  lidocaine (XYLOCAINE) 2 % solution Use as directed 10 mLs in the mouth or throat as needed. 08/03/18   Enid DerryWagner, September Mormile, PA-C  Misc Natural Products (OSTEO BI-FLEX TRIPLE STRENGTH PO) Take 1 tablet by mouth 2 (two) times daily.    [provider]  Olmesartan-Amlodipine-HCTZ 40-10-25 MG TABS Take 1 tablet by mouth daily. 06/16/15   [provider]  oxyCODONE-acetaminophen (PERCOCET) 5-325 MG tablet Take 1 tablet by mouth every 4 (four) hours as needed for severe pain. 08/03/18 08/03/19  Enid DerryWagner, Aviv Lengacher, PA-C  predniSONE (STERAPRED UNI-PAK 21 TAB) 10 MG (21) TBPK tablet Take by mouth daily. 06/14/18   Joni ReiningSmith, Ronald K, PA-C    Allergies Patient has no known allergies.  Family History  Problem Relation Age of Onset  . Hypertension Mother   . Hypertension Father     Social History Social History   Tobacco Use  . Smoking status: Never Smoker  . Smokeless tobacco: Former Engineer, [employer]  Substance Use Topics  . Alcohol use: No  . Drug use: No     Review of Systems  Constitutional: No fever/chills Cardiovascular: No chest pain. Respiratory:  No SOB. Gastrointestinal: No abdominal pain.  No nausea, no vomiting.  Musculoskeletal: Negative for musculoskeletal pain. Skin: Negative for rash, abrasions, lacerations, ecchymosis.   ____________________________________________  PHYSICAL EXAM:  VITAL SIGNS: ED Triage Vitals  Enc Vitals Group     BP 08/03/18 1343 (!) 218/98     Pulse Rate 08/03/18 1343 76     Resp 08/03/18 1343 18     Temp 08/03/18 1343 98.5 F (36.9 C)     Temp Source 08/03/18 1343 Oral     SpO2 08/03/18 1343 97 %     Weight 08/03/18 1345 (!) 315 lb (142.9 kg)     Height 08/03/18 1345 5' 11.5" (1.816 m)     Head Circumference --      Peak Flow --      Pain Score 08/03/18 1345 9     Pain Loc --      Pain Edu? --      Excl. in GC? --       Constitutional: Alert and oriented. Well appearing and in no acute distress. Eyes: Conjunctivae are normal. PERRL. EOMI. Head: Atraumatic. ENT:      Ears:      Nose: No congestion/rhinnorhea.      Mouth/Throat: Mucous membranes are moist. Large cavity to tooth #19 with purulent drainage in mouth. Minimal area of swelling with tenderness to palpation to the left jaw proximal to tooth. No palpable abscess. No difficulty opening or closing mouth. No neck pain or swelling. No submandibular swelling. No trismus.  Neck: No stridor. Cardiovascular: Normal rate, regular rhythm.  Good peripheral circulation. Respiratory: Normal respiratory effort without tachypnea or retractions. Lungs CTAB. Good air entry to the bases with no decreased or absent breath sounds. Musculoskeletal: Full range of motion to all extremities. No gross deformities appreciated. Neurologic:  Normal speech and language. No gross focal neurologic deficits are appreciated.  Skin:  Skin is warm, dry and intact. No rash noted. Psychiatric: Mood and affect are normal. Speech and behavior are normal. Patient exhibits appropriate insight and judgement.   ____________________________________________   LABS (all labs ordered are listed, but only abnormal results are displayed)  Labs Reviewed - No data to display ____________________________________________  EKG   ____________________________________________  RADIOLOGY   No results found.  ____________________________________________    PROCEDURES  Procedure(s) performed:    Procedures    Medications  oxyCODONE-acetaminophen (PERCOCET/ROXICET) 5-325 MG per tablet 1 tablet (1 tablet Oral Given 08/03/18 1424)  clindamycin (CLEOCIN) injection 600 mg (600 mg Intramuscular Given 08/03/18 1424)     ____________________________________________   INITIAL IMPRESSION / ASSESSMENT AND PLAN / ED COURSE  Pertinent labs & imaging results that were available during  my care of the patient were reviewed by me and considered in my medical decision making (see chart for details).  Review of the [place] CSRS was performed in accordance of the [facility] prior to dispensing any controlled drugs.     Patient's diagnosis is consistent with dental abscess.  Vital signs and exam are reassuring. Abscess is actively draining. He does have a very small area of swelling with no palpable abscess. No systemic symptoms. Patient has already been started on amoxicillin by dentist this week. He was given IM Clindimycin, and will add on clindamycin.  Patient has a history of hypertension.  Blood pressure checked in triage was checked with forearm and also likely elevated due to pain.  Blood pressure recheck was 157/83.  Patient's blood pressure medications were just increased by primary care.  He checks his blood pressure multiple times a day and keeps a blood pressure log.  Blood pressure earlier today was 158/98.  No headache, chest pain, shortness  of breath.  Patient has an appointment with his dentist in 2 weeks.  He will call his dentist on Monday for appointment moved up.  Patient will be discharged home with prescriptions for clindamycin, short course of percocet, viscous lidocaine. Patient is to follow up with PCP and dentist as directed. Patient is given ED precautions to return to the ED for any worsening or new symptoms.     ____________________________________________  FINAL CLINICAL IMPRESSION(S) / ED DIAGNOSES  Final diagnoses:  Dental abscess      NEW MEDICATIONS STARTED DURING THIS VISIT:  ED Discharge Orders         Ordered    clindamycin (CLEOCIN) 300 MG capsule  3 times daily     08/03/18 1421    oxyCODONE-acetaminophen (PERCOCET) 5-325 MG tablet  Every 4 hours PRN     08/03/18 1421    lidocaine (XYLOCAINE) 2 % solution  As needed     08/03/18 1421              This chart was dictated using voice recognition software/Dragon. Despite best efforts to  proofread, errors can occur which can change the meaning. Any change was purely unintentional.    Enid Derry, PA-C 08/03/18 1511    Jene Every, MD 08/03/18 [id number]

## 2018-08-03 NOTE — Discharge Instructions (Signed)
Continue amoxicillin and begin clindamycin. Return to the ED for worsening swelling, fevers, chills, any other symptoms concerning to you.  Follow-up with your primary care provider regarding blood pressure.  Return to emergency department immediately for headache, shortness of breath, chest pain.  Call your dentist on Monday for an appointment.

## 2018-08-03 NOTE — ED Notes (Signed)
.   Pt is resting, Respirations even and unlabored, NAD. Stretcher lowest postion and locked. Call bell within reach. Denies any needs at this time RN will continue to monitor.    

## 2018-08-03 NOTE — ED Triage Notes (Signed)
R lower dental pain x 4 days.  

## 2018-08-03 NOTE — ED Notes (Signed)
Pt verbalized understanding of discharge instructions. NAD at this time. 

## 2018-08-05 DIAGNOSIS — Z6841 Body Mass Index (BMI) 40.0 and over, adult: Secondary | ICD-10-CM | POA: Insufficient documentation

## 2018-08-11 DIAGNOSIS — M1612 Unilateral primary osteoarthritis, left hip: Secondary | ICD-10-CM | POA: Insufficient documentation

## 2018-09-10 ENCOUNTER — Other Ambulatory Visit: Payer: Self-pay | Admitting: Family Medicine

## 2018-09-10 DIAGNOSIS — I10 Essential (primary) hypertension: Secondary | ICD-10-CM

## 2018-10-03 ENCOUNTER — Ambulatory Visit: Payer: BLUE CROSS/BLUE SHIELD | Attending: Family Medicine

## 2018-10-09 ENCOUNTER — Inpatient Hospital Stay: Admission: RE | Admit: 2018-10-09 | Payer: BLUE CROSS/BLUE SHIELD | Source: Ambulatory Visit

## 2018-10-21 ENCOUNTER — Inpatient Hospital Stay: Admit: 2018-10-21 | Payer: BLUE CROSS/BLUE SHIELD | Admitting: Orthopedic Surgery

## 2018-10-21 SURGERY — ARTHROPLASTY, HIP, TOTAL,POSTERIOR APPROACH
Anesthesia: Choice | Laterality: Left

## 2018-11-19 ENCOUNTER — Other Ambulatory Visit: Payer: Self-pay

## 2018-11-19 ENCOUNTER — Encounter
Admission: RE | Admit: 2018-11-19 | Discharge: 2018-11-19 | Disposition: A | Discharge status: Home / Self Care | Payer: BC Managed Care – PPO | Source: Ambulatory Visit | Attending: Orthopedic Surgery | Admitting: Orthopedic Surgery

## 2018-11-19 DIAGNOSIS — Z01818 Encounter for other preprocedural examination: Secondary | ICD-10-CM | POA: Insufficient documentation

## 2018-11-19 LAB — URINALYSIS, ROUTINE W REFLEX MICROSCOPIC
Bilirubin Urine: NEGATIVE
Glucose, UA: NEGATIVE mg/dL
Hgb urine dipstick: NEGATIVE
Ketones, ur: NEGATIVE mg/dL
Leukocytes,Ua: NEGATIVE
Nitrite: NEGATIVE
Protein, ur: NEGATIVE mg/dL
Specific Gravity, Urine: 1.009 (ref 1.005–1.030)
pH: 7 (ref 5.0–8.0)

## 2018-11-19 LAB — COMPREHENSIVE METABOLIC PANEL
ALT: 24 U/L (ref 0–44)
AST: 20 U/L (ref 15–41)
Albumin: 4.5 g/dL (ref 3.5–5.0)
Alkaline Phosphatase: 81 U/L (ref 38–126)
Anion gap: 12 (ref 5–15)
BUN: 18 mg/dL (ref 6–20)
CO2: 28 mmol/L (ref 22–32)
Calcium: 9.3 mg/dL (ref 8.9–10.3)
Chloride: 98 mmol/L (ref 98–111)
Creatinine, Ser: 0.96 mg/dL (ref 0.61–1.24)
GFR calc Af Amer: >60 mL/min (ref >60)
GFR calc non Af Amer: >60 mL/min (ref >60)
Glucose, Bld: 119 mg/dL — ABNORMAL HIGH (ref 70–99)
Potassium: 3.3 mmol/L — ABNORMAL LOW (ref 3.5–5.1)
Sodium: 138 mmol/L (ref 135–145)
Total Bilirubin: 0.4 mg/dL (ref 0.3–1.2)
Total Protein: 7.9 g/dL (ref 6.5–8.1)

## 2018-11-19 LAB — CBC WITH DIFFERENTIAL/PLATELET
Abs Immature Granulocytes: 0.01 10*3/uL (ref 0.00–0.07)
Basophils Absolute: 0 10*3/uL (ref 0.0–0.1)
Basophils Relative: 0 %
Eosinophils Absolute: 0.1 10*3/uL (ref 0.0–0.5)
Eosinophils Relative: 2 %
HCT: 41.2 % (ref 39.0–52.0)
Hemoglobin: 13.8 g/dL (ref 13.0–17.0)
Immature Granulocytes: 0 %
Lymphocytes Relative: 35 %
Lymphs Abs: 2.5 10*3/uL (ref 0.7–4.0)
MCH: 28.3 pg (ref 26.0–34.0)
MCHC: 33.5 g/dL (ref 30.0–36.0)
MCV: 84.4 fL (ref 80.0–100.0)
Monocytes Absolute: 0.6 10*3/uL (ref 0.1–1.0)
Monocytes Relative: 8 %
Neutro Abs: 3.8 10*3/uL (ref 1.7–7.7)
Neutrophils Relative %: 55 %
Platelets: 276 10*3/uL (ref 150–400)
RBC: 4.88 MIL/uL (ref 4.22–5.81)
RDW: 13 % (ref 11.5–15.5)
WBC: 7 10*3/uL (ref 4.0–10.5)
nRBC: 0 % (ref 0.0–0.2)

## 2018-11-19 LAB — C-REACTIVE PROTEIN: CRP: <0.8 mg/dL (ref <1.0)

## 2018-11-19 LAB — SEDIMENTATION RATE: Sed Rate: 12 mm/hr (ref 0–20)

## 2018-11-19 LAB — TYPE AND SCREEN
ABO/RH(D): O POS
Antibody Screen: NEGATIVE

## 2018-11-19 LAB — PROTIME-INR
INR: 1 (ref 0.8–1.2)
Prothrombin Time: 13.2 seconds (ref 11.4–15.2)

## 2018-11-19 LAB — APTT: aPTT: 28 seconds (ref 24–36)

## 2018-11-19 LAB — SURGICAL PCR SCREEN
MRSA, PCR: NEGATIVE
Staphylococcus aureus: NEGATIVE

## 2018-11-19 NOTE — Patient Instructions (Signed)
Your procedure is scheduled on:Mon 6/22 Report to Day Surgery. To find out your arrival time please call [phone] between 1PM - 3PM on Friday 6/19.  Remember: Instructions that are not followed completely may result in serious medical risk,  up to and including death, or upon the discretion of your surgeon and anesthesiologist your  surgery may need to be rescheduled.     _X__ 1. Do not eat food after midnight the night before your procedure.                 No gum chewing or hard candies. You may drink clear liquids up to 2 hours                 before you are scheduled to arrive for your surgery- DO not drink clear                 liquids within 2 hours of the start of your surgery.                 Clear Liquids include:  water, apple juice without pulp, clear carbohydrate                 drink such as Clearfast of Gatorade, Black Coffee or Tea (Do not add                 anything to coffee or tea).  __X__2.  On the morning of surgery brush your teeth with toothpaste and water, you                may rinse your mouth with mouthwash if you wish.  Do not swallow any toothpaste of mouthwash.     _X__ 3.  No Alcohol for 24 hours before or after surgery.   ___ 4.  Do Not Smoke or use e-cigarettes For 24 Hours Prior to Your Surgery.                 Do not use any chewable tobacco products for at least 6 hours prior to                 surgery.  ____  5.  Bring all medications with you on the day of surgery if instructed.   __x__  6.  Notify your doctor if there is any change in your medical condition      (cold, fever, infections).     Do not wear jewelry, make-up, hairpins, clips or nail polish. Do not wear lotions, powders, or perfumes. You may wear deodorant. Do not shave 48 hours prior to surgery. Men may shave face and neck. Do not bring valuables to the hospital.     [facility] is not responsible for any belongings or valuables.  Contacts, dentures  or bridgework may not be worn into surgery. Leave your suitcase in the car. After surgery it may be brought to your room. For patients admitted to the hospital, discharge time is determined by your treatment team.   Patients discharged the day of surgery will not be allowed to drive home.   Please read over the following fact sheets that you were given:    __x__ Take these medicines the morning of surgery with A SIP OF WATER:    1.metoprolol tartrate (LOPRESSOR) 100 MG tablet  2.   3.   4.  5.  6.  ____ Fleet Enema (as directed)   _x___ Use CHG Soap as directed  ____ Use inhalers on  the day of surgery  ____ Stop metformin 2 days prior to surgery    ____ Take 1/2 of usual insulin dose the night before surgery. No insulin the morning          of surgery.   ____ Stop Coumadin/Plavix/aspirin on   __x__ Stopped Anti-inflammatories naproxen sodium (ALEVE) 220 MG tablet already   ____ Stop supplements until after surgery.    ____ Bring C-Pap to the hospital.

## 2018-11-20 LAB — URINE CULTURE
Culture: NO GROWTH
Special Requests: NORMAL

## 2018-11-21 ENCOUNTER — Other Ambulatory Visit: Payer: Self-pay

## 2018-11-21 ENCOUNTER — Other Ambulatory Visit
Admission: RE | Admit: 2018-11-21 | Discharge: 2018-11-21 | Disposition: A | Discharge status: Home / Self Care | Payer: BC Managed Care – PPO | Source: Ambulatory Visit | Attending: Orthopedic Surgery | Admitting: Orthopedic Surgery

## 2018-11-21 ENCOUNTER — Other Ambulatory Visit: Payer: BLUE CROSS/BLUE SHIELD

## 2018-11-21 DIAGNOSIS — Z1159 Encounter for screening for other viral diseases: Secondary | ICD-10-CM | POA: Insufficient documentation

## 2018-11-22 LAB — NOVEL CORONAVIRUS, NAA (HOSP ORDER, SEND-OUT TO REF LAB; TAT 18-24 HRS): SARS-CoV-2, NAA: NOT DETECTED

## 2018-11-24 MED ORDER — DEXTROSE 5 % IV SOLN
3.0000 g | INTRAVENOUS | Status: AC
  Administered 2018-11-25: 2 g via INTRAVENOUS
  Filled 2018-11-24: qty 3

## 2018-11-24 MED ORDER — TRANEXAMIC ACID-NACL 1000-0.7 MG/100ML-% IV SOLN
1000.0000 mg | INTRAVENOUS | Status: AC
  Administered 2018-11-25: 1000 mg via INTRAVENOUS
  Filled 2018-11-24: qty 100

## 2018-11-25 ENCOUNTER — Other Ambulatory Visit: Payer: Self-pay

## 2018-11-25 ENCOUNTER — Encounter
Admission: RE | Disposition: A | Discharge status: Home Health | Payer: Self-pay | Source: Home / Self Care | Attending: Orthopedic Surgery

## 2018-11-25 ENCOUNTER — Inpatient Hospital Stay: Payer: BC Managed Care – PPO | Admitting: Family

## 2018-11-25 ENCOUNTER — Inpatient Hospital Stay: Payer: BC Managed Care – PPO

## 2018-11-25 ENCOUNTER — Encounter: Payer: Self-pay | Admitting: Orthopedic Surgery

## 2018-11-25 ENCOUNTER — Inpatient Hospital Stay
Admission: RE | Admit: 2018-11-25 | Discharge: 2018-11-27 | DRG: 470 | Disposition: A | Discharge status: Home Health | Payer: BC Managed Care – PPO | Attending: Orthopedic Surgery | Admitting: Orthopedic Surgery

## 2018-11-25 DIAGNOSIS — Z6841 Body Mass Index (BMI) 40.0 and over, adult: Secondary | ICD-10-CM | POA: Diagnosis not present

## 2018-11-25 DIAGNOSIS — E785 Hyperlipidemia, unspecified: Secondary | ICD-10-CM | POA: Diagnosis present

## 2018-11-25 DIAGNOSIS — Z79899 Other long term (current) drug therapy: Secondary | ICD-10-CM

## 2018-11-25 DIAGNOSIS — I10 Essential (primary) hypertension: Secondary | ICD-10-CM | POA: Diagnosis present

## 2018-11-25 DIAGNOSIS — Z96649 Presence of unspecified artificial hip joint: Secondary | ICD-10-CM

## 2018-11-25 DIAGNOSIS — G4733 Obstructive sleep apnea (adult) (pediatric): Secondary | ICD-10-CM | POA: Diagnosis present

## 2018-11-25 DIAGNOSIS — M1612 Unilateral primary osteoarthritis, left hip: Principal | ICD-10-CM | POA: Diagnosis present

## 2018-11-25 HISTORY — PX: TOTAL HIP ARTHROPLASTY: SHX124

## 2018-11-25 LAB — ABO/RH: ABO/RH(D): O POS

## 2018-11-25 SURGERY — ARTHROPLASTY, HIP, TOTAL,POSTERIOR APPROACH
Anesthesia: Spinal | Site: Hip | Laterality: Left

## 2018-11-25 MED ORDER — MIDAZOLAM HCL 5 MG/5ML IJ SOLN
INTRAMUSCULAR | Status: DC | PRN
  Administered 2018-11-25: 2 mg via INTRAVENOUS

## 2018-11-25 MED ORDER — SODIUM CHLORIDE 0.9 % IV SOLN
INTRAVENOUS | Status: DC | PRN
  Administered 2018-11-25: 50 ug/min via INTRAVENOUS

## 2018-11-25 MED ORDER — BISACODYL 10 MG RE SUPP: 10.0000 mg | Freq: Every day | RECTAL | Status: DC | PRN

## 2018-11-25 MED ORDER — GABAPENTIN 300 MG PO CAPS
ORAL_CAPSULE | ORAL | Status: AC
  Administered 2018-11-25: 300 mg via ORAL
  Filled 2018-11-25: qty 1

## 2018-11-25 MED ORDER — HYDROMORPHONE HCL 1 MG/ML IJ SOLN
0.5000 mg | INTRAMUSCULAR | Status: DC | PRN
  Administered 2018-11-25: 1 mg via INTRAVENOUS
  Filled 2018-11-25: qty 1

## 2018-11-25 MED ORDER — PANTOPRAZOLE SODIUM 40 MG PO TBEC
40.0000 mg | DELAYED_RELEASE_TABLET | Freq: Two times a day (BID) | ORAL | Status: DC
  Administered 2018-11-25 – 2018-11-27 (×4): 40 mg via ORAL
  Filled 2018-11-25 (×4): qty 1

## 2018-11-25 MED ORDER — OXYMETAZOLINE HCL 0.05 % NA SOLN
1.0000 | Freq: Every day | NASAL | Status: DC
  Filled 2018-11-25: qty 15

## 2018-11-25 MED ORDER — POTASSIUM CHLORIDE CRYS ER 20 MEQ PO TBCR
20.0000 meq | EXTENDED_RELEASE_TABLET | Freq: Two times a day (BID) | ORAL | Status: DC
  Administered 2018-11-25 – 2018-11-27 (×4): 20 meq via ORAL
  Filled 2018-11-25 (×4): qty 1

## 2018-11-25 MED ORDER — CELECOXIB 200 MG PO CAPS
200.0000 mg | ORAL_CAPSULE | Freq: Two times a day (BID) | ORAL | Status: DC
  Administered 2018-11-25 – 2018-11-27 (×4): 200 mg via ORAL
  Filled 2018-11-25 (×5): qty 1

## 2018-11-25 MED ORDER — BUPIVACAINE HCL (PF) 0.5 % IJ SOLN
INTRAMUSCULAR | Status: DC | PRN
  Administered 2018-11-25: 2.5 mL via INTRATHECAL

## 2018-11-25 MED ORDER — PROPOFOL 10 MG/ML IV BOLUS
INTRAVENOUS | Status: AC
  Filled 2018-11-25: qty 40

## 2018-11-25 MED ORDER — FLEET ENEMA 7-19 GM/118ML RE ENEM: 1.0000 | ENEMA | Freq: Once | RECTAL | Status: DC | PRN

## 2018-11-25 MED ORDER — SODIUM CHLORIDE 0.9 % IV SOLN
INTRAVENOUS | Status: DC
  Administered 2018-11-25 – 2018-11-26 (×3): via INTRAVENOUS

## 2018-11-25 MED ORDER — DEXMEDETOMIDINE HCL 200 MCG/2ML IV SOLN
INTRAVENOUS | Status: DC | PRN
  Administered 2018-11-25 (×2): 12 ug via INTRAVENOUS
  Administered 2018-11-25: 8 ug via INTRAVENOUS

## 2018-11-25 MED ORDER — METOCLOPRAMIDE HCL 5 MG/ML IJ SOLN: 5.0000 mg | Freq: Three times a day (TID) | INTRAMUSCULAR | Status: DC | PRN

## 2018-11-25 MED ORDER — ONDANSETRON HCL 4 MG PO TABS: 4.0000 mg | ORAL_TABLET | Freq: Four times a day (QID) | ORAL | Status: DC | PRN

## 2018-11-25 MED ORDER — FENTANYL CITRATE (PF) 100 MCG/2ML IJ SOLN
INTRAMUSCULAR | Status: AC
  Administered 2018-11-25: 25 ug via INTRAVENOUS
  Filled 2018-11-25: qty 2

## 2018-11-25 MED ORDER — AMLODIPINE BESYLATE 10 MG PO TABS
10.0000 mg | ORAL_TABLET | Freq: Every day | ORAL | Status: DC
  Administered 2018-11-26 – 2018-11-27 (×2): 10 mg via ORAL
  Filled 2018-11-25 (×2): qty 1

## 2018-11-25 MED ORDER — MIDAZOLAM HCL 2 MG/2ML IJ SOLN
INTRAMUSCULAR | Status: AC
  Filled 2018-11-25: qty 2

## 2018-11-25 MED ORDER — AMLODIPINE-OLMESARTAN 10-40 MG PO TABS: 1.0000 | ORAL_TABLET | Freq: Every day | ORAL | Status: DC

## 2018-11-25 MED ORDER — FENTANYL CITRATE (PF) 100 MCG/2ML IJ SOLN
INTRAMUSCULAR | Status: AC
  Filled 2018-11-25: qty 2

## 2018-11-25 MED ORDER — DIPHENHYDRAMINE HCL 12.5 MG/5ML PO ELIX: 12.5000 mg | ORAL_SOLUTION | ORAL | Status: DC | PRN

## 2018-11-25 MED ORDER — OXYCODONE HCL 5 MG PO TABS
ORAL_TABLET | ORAL | Status: AC
  Filled 2018-11-25: qty 1

## 2018-11-25 MED ORDER — ALUM & MAG HYDROXIDE-SIMETH 200-200-20 MG/5ML PO SUSP: 30.0000 mL | ORAL | Status: DC | PRN

## 2018-11-25 MED ORDER — TRANEXAMIC ACID-NACL 1000-0.7 MG/100ML-% IV SOLN
1000.0000 mg | Freq: Once | INTRAVENOUS | Status: AC
  Administered 2018-11-25: 1000 mg via INTRAVENOUS
  Filled 2018-11-25: qty 100

## 2018-11-25 MED ORDER — ACETAMINOPHEN 10 MG/ML IV SOLN
1000.0000 mg | Freq: Four times a day (QID) | INTRAVENOUS | Status: AC
  Administered 2018-11-25 – 2018-11-26 (×4): 1000 mg via INTRAVENOUS
  Filled 2018-11-25 (×4): qty 100

## 2018-11-25 MED ORDER — PROPOFOL 10 MG/ML IV BOLUS
INTRAVENOUS | Status: DC | PRN
  Administered 2018-11-25: 20 mg via INTRAVENOUS
  Administered 2018-11-25: 10 mg via INTRAVENOUS

## 2018-11-25 MED ORDER — PROPOFOL 10 MG/ML IV BOLUS
INTRAVENOUS | Status: AC
  Filled 2018-11-25: qty 20

## 2018-11-25 MED ORDER — ACETAMINOPHEN 10 MG/ML IV SOLN
1000.0000 mg | Freq: Once | INTRAVENOUS | Status: AC
  Administered 2018-11-25: 1000 mg via INTRAVENOUS

## 2018-11-25 MED ORDER — MAGNESIUM HYDROXIDE 400 MG/5ML PO SUSP
30.0000 mL | Freq: Every day | ORAL | Status: DC
  Administered 2018-11-26 – 2018-11-27 (×2): 30 mL via ORAL
  Filled 2018-11-25 (×2): qty 30

## 2018-11-25 MED ORDER — SENNOSIDES-DOCUSATE SODIUM 8.6-50 MG PO TABS
1.0000 | ORAL_TABLET | Freq: Two times a day (BID) | ORAL | Status: DC
  Administered 2018-11-25 – 2018-11-27 (×4): 1 via ORAL
  Filled 2018-11-25 (×6): qty 1

## 2018-11-25 MED ORDER — PHENOL 1.4 % MT LIQD: 1.0000 | OROMUCOSAL | Status: DC | PRN

## 2018-11-25 MED ORDER — IRBESARTAN 150 MG PO TABS
300.0000 mg | ORAL_TABLET | Freq: Every day | ORAL | Status: DC
  Administered 2018-11-26 – 2018-11-27 (×2): 300 mg via ORAL
  Filled 2018-11-25 (×2): qty 2

## 2018-11-25 MED ORDER — ONDANSETRON HCL 4 MG/2ML IJ SOLN: 4.0000 mg | Freq: Four times a day (QID) | INTRAMUSCULAR | Status: DC | PRN

## 2018-11-25 MED ORDER — TRAMADOL HCL 50 MG PO TABS
50.0000 mg | ORAL_TABLET | ORAL | Status: DC | PRN
  Administered 2018-11-25 – 2018-11-26 (×4): 100 mg via ORAL
  Filled 2018-11-25 (×4): qty 2

## 2018-11-25 MED ORDER — EPHEDRINE SULFATE 50 MG/ML IJ SOLN
INTRAMUSCULAR | Status: DC | PRN
  Administered 2018-11-25: 50 mg via INTRAVENOUS

## 2018-11-25 MED ORDER — OXYCODONE HCL 5 MG PO TABS
5.0000 mg | ORAL_TABLET | Freq: Once | ORAL | Status: AC | PRN
  Administered 2018-11-25: 5 mg via ORAL

## 2018-11-25 MED ORDER — PROPOFOL 500 MG/50ML IV EMUL
INTRAVENOUS | Status: AC
  Filled 2018-11-25: qty 50

## 2018-11-25 MED ORDER — ACETAMINOPHEN 10 MG/ML IV SOLN
INTRAVENOUS | Status: AC
  Administered 2018-11-25: 1000 mg via INTRAVENOUS
  Filled 2018-11-25: qty 100

## 2018-11-25 MED ORDER — CHLORHEXIDINE GLUCONATE 4 % EX LIQD: 60.0000 mL | Freq: Once | CUTANEOUS | Status: DC

## 2018-11-25 MED ORDER — LIDOCAINE HCL (PF) 2 % IJ SOLN
INTRAMUSCULAR | Status: AC
  Filled 2018-11-25: qty 10

## 2018-11-25 MED ORDER — LACTATED RINGERS IV SOLN
INTRAVENOUS | Status: DC
  Administered 2018-11-25 (×3): via INTRAVENOUS

## 2018-11-25 MED ORDER — TETRACAINE HCL 1 % IJ SOLN
INTRAMUSCULAR | Status: DC | PRN
  Administered 2018-11-25: 5 mg via INTRASPINAL

## 2018-11-25 MED ORDER — MENTHOL 3 MG MT LOZG: 1.0000 | LOZENGE | OROMUCOSAL | Status: DC | PRN

## 2018-11-25 MED ORDER — TETRACAINE HCL 1 % IJ SOLN
INTRAMUSCULAR | Status: AC
  Filled 2018-11-25: qty 2

## 2018-11-25 MED ORDER — SPIRONOLACTONE 25 MG PO TABS
25.0000 mg | ORAL_TABLET | Freq: Every day | ORAL | Status: DC
  Administered 2018-11-26 – 2018-11-27 (×2): 25 mg via ORAL
  Filled 2018-11-25 (×2): qty 1

## 2018-11-25 MED ORDER — GABAPENTIN 300 MG PO CAPS
300.0000 mg | ORAL_CAPSULE | Freq: Every day | ORAL | Status: DC
  Administered 2018-11-25 – 2018-11-26 (×2): 300 mg via ORAL
  Filled 2018-11-25 (×2): qty 1

## 2018-11-25 MED ORDER — ENSURE ENLIVE PO LIQD: 296.0000 mL | Freq: Once | ORAL | Status: DC

## 2018-11-25 MED ORDER — FERROUS SULFATE 325 (65 FE) MG PO TABS
325.0000 mg | ORAL_TABLET | Freq: Two times a day (BID) | ORAL | Status: DC
  Administered 2018-11-25 – 2018-11-27 (×4): 325 mg via ORAL
  Filled 2018-11-25 (×4): qty 1

## 2018-11-25 MED ORDER — METOCLOPRAMIDE HCL 10 MG PO TABS
10.0000 mg | ORAL_TABLET | Freq: Three times a day (TID) | ORAL | Status: AC
  Administered 2018-11-25 – 2018-11-27 (×8): 10 mg via ORAL
  Filled 2018-11-25 (×8): qty 1

## 2018-11-25 MED ORDER — DEXMEDETOMIDINE HCL IN NACL 400 MCG/100ML IV SOLN
INTRAVENOUS | Status: DC | PRN
  Administered 2018-11-25: .3 ug/kg/h via INTRAVENOUS

## 2018-11-25 MED ORDER — NEOMYCIN-POLYMYXIN B GU 40-200000 IR SOLN
Status: AC
  Filled 2018-11-25: qty 1

## 2018-11-25 MED ORDER — FAMOTIDINE 20 MG PO TABS
ORAL_TABLET | ORAL | Status: AC
  Administered 2018-11-25: 07:00:00 20 mg via ORAL
  Filled 2018-11-25: qty 1

## 2018-11-25 MED ORDER — FENTANYL CITRATE (PF) 100 MCG/2ML IJ SOLN
25.0000 ug | INTRAMUSCULAR | Status: AC | PRN
  Administered 2018-11-25: 12:00:00 25 ug via INTRAVENOUS
  Administered 2018-11-25 (×2): 50 ug via INTRAVENOUS
  Administered 2018-11-25 (×3): 25 ug via INTRAVENOUS

## 2018-11-25 MED ORDER — METOPROLOL TARTRATE 50 MG PO TABS
100.0000 mg | ORAL_TABLET | Freq: Two times a day (BID) | ORAL | Status: DC
  Administered 2018-11-25 – 2018-11-27 (×4): 100 mg via ORAL
  Filled 2018-11-25 (×5): qty 2

## 2018-11-25 MED ORDER — PROPOFOL 500 MG/50ML IV EMUL
INTRAVENOUS | Status: DC | PRN
  Administered 2018-11-25: 50 ug/kg/min via INTRAVENOUS
  Administered 2018-11-25: 100 ug/kg/min via INTRAVENOUS

## 2018-11-25 MED ORDER — CHLORTHALIDONE 25 MG PO TABS
25.0000 mg | ORAL_TABLET | Freq: Every day | ORAL | Status: DC
  Administered 2018-11-26 – 2018-11-27 (×2): 25 mg via ORAL
  Filled 2018-11-25 (×3): qty 1

## 2018-11-25 MED ORDER — CELECOXIB 200 MG PO CAPS
ORAL_CAPSULE | ORAL | Status: AC
  Administered 2018-11-25: 400 mg via ORAL
  Filled 2018-11-25: qty 2

## 2018-11-25 MED ORDER — DEXAMETHASONE SODIUM PHOSPHATE 10 MG/ML IJ SOLN
INTRAMUSCULAR | Status: AC
  Administered 2018-11-25: 8 mg via INTRAVENOUS
  Filled 2018-11-25: qty 1

## 2018-11-25 MED ORDER — PHENYLEPHRINE HCL (PRESSORS) 10 MG/ML IV SOLN
INTRAVENOUS | Status: DC | PRN
  Administered 2018-11-25 (×3): 100 ug via INTRAVENOUS

## 2018-11-25 MED ORDER — ENOXAPARIN SODIUM 40 MG/0.4ML ~~LOC~~ SOLN
40.0000 mg | Freq: Two times a day (BID) | SUBCUTANEOUS | Status: DC
  Administered 2018-11-26 – 2018-11-27 (×3): 40 mg via SUBCUTANEOUS
  Filled 2018-11-25 (×4): qty 0.4

## 2018-11-25 MED ORDER — OXYCODONE HCL 5 MG/5ML PO SOLN: 5.0000 mg | Freq: Once | ORAL | Status: AC | PRN

## 2018-11-25 MED ORDER — OXYCODONE HCL 5 MG PO TABS
10.0000 mg | ORAL_TABLET | ORAL | Status: DC | PRN
  Administered 2018-11-25 – 2018-11-27 (×9): 10 mg via ORAL
  Filled 2018-11-25 (×10): qty 2

## 2018-11-25 MED ORDER — CELECOXIB 200 MG PO CAPS
400.0000 mg | ORAL_CAPSULE | Freq: Once | ORAL | Status: AC
  Administered 2018-11-25: 400 mg via ORAL

## 2018-11-25 MED ORDER — GABAPENTIN 300 MG PO CAPS
300.0000 mg | ORAL_CAPSULE | Freq: Once | ORAL | Status: AC
  Administered 2018-11-25: 300 mg via ORAL

## 2018-11-25 MED ORDER — CEFAZOLIN SODIUM-DEXTROSE 2-4 GM/100ML-% IV SOLN
2.0000 g | Freq: Four times a day (QID) | INTRAVENOUS | Status: AC
  Administered 2018-11-25 – 2018-11-26 (×4): 2 g via INTRAVENOUS
  Filled 2018-11-25 (×5): qty 100

## 2018-11-25 MED ORDER — OXYCODONE HCL 5 MG PO TABS: 5.0000 mg | ORAL_TABLET | ORAL | Status: DC | PRN

## 2018-11-25 MED ORDER — DEXAMETHASONE SODIUM PHOSPHATE 10 MG/ML IJ SOLN
8.0000 mg | Freq: Once | INTRAMUSCULAR | Status: AC
  Administered 2018-11-25: 8 mg via INTRAVENOUS

## 2018-11-25 MED ORDER — METOCLOPRAMIDE HCL 10 MG PO TABS: 5.0000 mg | ORAL_TABLET | Freq: Three times a day (TID) | ORAL | Status: DC | PRN

## 2018-11-25 MED ORDER — FAMOTIDINE 20 MG PO TABS
20.0000 mg | ORAL_TABLET | Freq: Once | ORAL | Status: AC
  Administered 2018-11-25: 20 mg via ORAL

## 2018-11-25 MED ORDER — ACETAMINOPHEN 325 MG PO TABS: 325.0000 mg | ORAL_TABLET | Freq: Four times a day (QID) | ORAL | Status: DC | PRN

## 2018-11-25 MED ORDER — NEOMYCIN-POLYMYXIN B GU 40-200000 IR SOLN
Status: DC | PRN
  Administered 2018-11-25: 14 mL

## 2018-11-25 SURGICAL SUPPLY — 56 items
BLADE DRUM FLTD (BLADE) ×3 IMPLANT
BLADE SAW 90X25X1.19 OSCILLAT (BLADE) ×3 IMPLANT
CANISTER SUCT 1200ML W/VALVE (MISCELLANEOUS) ×3 IMPLANT
CANISTER SUCT 3000ML PPV (MISCELLANEOUS) ×6 IMPLANT
CARTRIDGE OIL MAESTRO DRILL (MISCELLANEOUS) ×1 IMPLANT
COVER WAND RF STERILE (DRAPES) ×3 IMPLANT
DIFFUSER DRILL AIR PNEUMATIC (MISCELLANEOUS) ×3 IMPLANT
DRAPE INCISE IOBAN 66X45 STRL (DRAPES) ×2 IMPLANT
DRAPE INCISE IOBAN 66X60 STRL (DRAPES) ×3 IMPLANT
DRAPE SHEET LG 3/4 BI-LAMINATE (DRAPES) ×3 IMPLANT
DRSG DERMACEA 8X12 NADH (GAUZE/BANDAGES/DRESSINGS) ×3 IMPLANT
DRSG OPSITE POSTOP 3X4 (GAUZE/BANDAGES/DRESSINGS) ×2 IMPLANT
DRSG OPSITE POSTOP 4X12 (GAUZE/BANDAGES/DRESSINGS) ×3 IMPLANT
DRSG OPSITE POSTOP 4X14 (GAUZE/BANDAGES/DRESSINGS) ×2 IMPLANT
DRSG TEGADERM 4X4.75 (GAUZE/BANDAGES/DRESSINGS) ×3 IMPLANT
DURAPREP 26ML APPLICATOR (WOUND CARE) ×5 IMPLANT
GLOVE BIOGEL M STRL SZ7.5 (GLOVE) ×6 IMPLANT
GLOVE INDICATOR 8.0 STRL GRN (GLOVE) ×3 IMPLANT
GOWN STRL REUS W/ TWL LRG LVL3 (GOWN DISPOSABLE) ×2 IMPLANT
GOWN STRL REUS W/TWL LRG LVL3 (GOWN DISPOSABLE) ×4
HEAD M SROM 36MM PLUS 1.5 (Hips) IMPLANT
HEMOVAC 400CC 10FR (MISCELLANEOUS) ×3 IMPLANT
HOLDER FOLEY CATH W/STRAP (MISCELLANEOUS) ×3 IMPLANT
HOOD PEEL AWAY FLYTE STAYCOOL (MISCELLANEOUS) ×8 IMPLANT
KIT TURNOVER KIT A (KITS) ×3 IMPLANT
LINER ACETAB NEUTRAL 36ID 520D (Liner) ×2 IMPLANT
MANIFOLD NEPTUNE WASTE (CANNULA) ×3 IMPLANT
NDL SAFETY ECLIPSE 18X1.5 (NEEDLE) ×1 IMPLANT
NEEDLE HYPO 18GX1.5 SHARP (NEEDLE) ×2
NS IRRIG 500ML POUR BTL (IV SOLUTION) ×3 IMPLANT
OIL CARTRIDGE MAESTRO DRILL (MISCELLANEOUS) ×3
PACK HIP PROSTHESIS (MISCELLANEOUS) ×3 IMPLANT
PENCIL SMOKE ULTRAEVAC 22 CON (MISCELLANEOUS) ×3 IMPLANT
PIN SECTOR W/GRIP ACE CUP 52MM (Hips) ×2 IMPLANT
PIN STEIN THRED 5/32 (Pin) ×3 IMPLANT
PULSAVAC PLUS IRRIG FAN TIP (DISPOSABLE) ×3
SOL .9 NS 3000ML IRR  AL (IV SOLUTION) ×2
SOL .9 NS 3000ML IRR UROMATIC (IV SOLUTION) ×1 IMPLANT
SOL PREP PVP 2OZ (MISCELLANEOUS) ×3
SOLUTION PREP PVP 2OZ (MISCELLANEOUS) ×1 IMPLANT
SPONGE DRAIN TRACH 4X4 STRL 2S (GAUZE/BANDAGES/DRESSINGS) ×3 IMPLANT
SROM M HEAD 36MM PLUS 1.5 (Hips) ×3 IMPLANT
STAPLER SKIN PROX 35W (STAPLE) ×3 IMPLANT
STEM FEM CMNTLSS SM AML 15.0 (Hips) ×2 IMPLANT
SUT ETHIBOND #5 BRAIDED 30INL (SUTURE) ×3 IMPLANT
SUT VIC AB 0 CT1 36 (SUTURE) ×3 IMPLANT
SUT VIC AB 1 CT1 36 (SUTURE) ×6 IMPLANT
SUT VIC AB 2-0 CT1 27 (SUTURE) ×2
SUT VIC AB 2-0 CT1 TAPERPNT 27 (SUTURE) ×1 IMPLANT
SUT VIC AB 2-0 CT2 27 (SUTURE) ×2 IMPLANT
SYR 20CC LL (SYRINGE) ×3 IMPLANT
TAPE CLOTH 3X10 WHT NS LF (GAUZE/BANDAGES/DRESSINGS) ×3 IMPLANT
TAPE TRANSPORE STRL 2 31045 (GAUZE/BANDAGES/DRESSINGS) ×3 IMPLANT
TIP FAN IRRIG PULSAVAC PLUS (DISPOSABLE) ×1 IMPLANT
TOWEL OR 17X26 4PK STRL BLUE (TOWEL DISPOSABLE) ×3 IMPLANT
TRAY FOLEY MTR SLVR 16FR STAT (SET/KITS/TRAYS/PACK) ×3 IMPLANT

## 2018-11-25 NOTE — Transfer of Care (Signed)
Immediate Anesthesia Transfer of Care Note  Patient: Jack Campbell.  Procedure(s) Performed: TOTAL HIP ARTHROPLASTY LEFT (Left Hip)  Patient Location: PACU  Anesthesia Type:Spinal  Level of Consciousness: awake, alert  and drowsy  Airway & Oxygen Therapy: Patient Spontanous Breathing and Patient connected to face mask oxygen  Post-op Assessment: Report given to RN and Post -op Vital signs reviewed and stable  Post vital signs: Reviewed and stable  Last Vitals:  Vitals Value Taken Time  BP 94/54 11/25/18 1200  Temp 36.5 C 11/25/18 1155  Pulse 63 11/25/18 1207  Resp 14 11/25/18 1207  SpO2 100 % 11/25/18 1207  Vitals shown include unvalidated device data.  Last Pain:  Vitals:   11/25/18 1155  TempSrc:   PainSc: Asleep         Complications: No apparent anesthesia complications

## 2018-11-25 NOTE — Anesthesia Post-op Follow-up Note (Signed)
Anesthesia QCDR form completed.        

## 2018-11-25 NOTE — TOC Initial Note (Signed)
Transition of Care (TOC) - Initial/Assessment Note    Patient Details  Name: Jack Campbell. MRN: [id number] Date of Birth: 01/10/1966  Transition of Care [facility]) CM/SW Contact:    Cady Hafen, Lenice Llamas Phone Number: [phone]  11/25/2018, 3:10 PM  Clinical Narrative: Clinical Social Worker (CSW) received consult to arrange home health. PT is pending, patient is post op day 0. CSW met with patient alone at bedside to discuss D/C plan. Patient was alert and oriented X4. Patient lives in an apartment in [place] with his wife Jack Campbell. Per patient he plans on going home and his son Jack Campbell will be staying with him during the day while his wife is at work. Per patient his wife is taking off work next week to be home with him. CSW explained that patient's surgeon prefers [facility] for home health. Patient is agreeable to using [facility]. Per Helene Kelp [facility] home health representative they can accept patient. Per patient he needs a rolling walker and bedside commode. CSW made [place] DME agency representative aware of above. CSW notified patient of his Lovenox price $25. Patient is okay with Lovenox price. CSW will continue to follow and assist as needed.               Expected Discharge Plan: [place] Barriers to Discharge: Continued Medical Work up   Patient Goals and CMS Choice Patient states their goals for this hospitalization and ongoing recovery are:: To improve mobility CMS Medicare.gov Compare Post Acute Care list provided to:: (Patient's surgeon's office arranged [facility] for home health prior to hospital admission.) Choice offered to / list presented to : Patient  Expected Discharge Plan and Services Expected Discharge Plan: [place] In-house Referral: Clinical Social Work Discharge Planning Services: CM Consult Post Acute Care Choice: [place] arrangements for the past 2 months: Apartment                 DME Arranged: Bedside  commode, Walker rolling DME Agency: [employer] Date DME Agency Contacted: 11/25/18 Time DME Agency Contacted: [phone] Representative spoke with at DME Agency: [place]: PT [place]: [facility] (formerly [employer]) Date [place]: 11/25/18 Time [place]: [place] Representative spoke with at [place]: [place] Arrangements/Services Living arrangements for the past 2 months: [place] with:: Spouse Patient language and need for interpreter reviewed:: No Do you feel safe going back to the place where you live?: Yes      Need for Family Participation in Patient Care: No (Comment) Care giver support system in place?: Yes (comment) Current home services: DME([place]) Criminal Activity/Legal Involvement Pertinent to Current Situation/Hospitalization: No - Comment as needed  Activities of Daily Living Home Assistive Devices/Equipment: Cane (specify quad or straight) ADL Screening (condition at time of admission) Patient's cognitive ability adequate to safely complete daily activities?: Yes Is the patient deaf or have difficulty hearing?: No Does the patient have difficulty seeing, even when wearing glasses/contacts?: No Does the patient have difficulty concentrating, remembering, or making decisions?: No Patient able to express need for assistance with ADLs?: Yes Does the patient have difficulty dressing or bathing?: No Independently performs ADLs?: Yes (appropriate for developmental age) Does the patient have difficulty walking or climbing stairs?: Yes Weakness of Legs: Left Weakness of Arms/Hands: None  Permission Sought/Granted Permission sought to share information with : (Home Health and DME agency) Permission granted to share information with : Yes, Verbal Permission  Granted              Emotional Assessment Appearance:: Appears stated age   Affect (typically observed): Calm, Pleasant Orientation: : Oriented to Self,  Oriented to  Time, Oriented to Place, Oriented to Situation Alcohol / Substance Use: Not Applicable Psych Involvement: No (comment)  Admission diagnosis:  PRIMARY OSTEOARTHRITIS OF LEFT HIP Patient Active Problem List   Diagnosis Date Noted  . H/O total hip arthroplasty 11/25/2018  . Primary osteoarthritis of left hip 08/11/2018  . BMI 40.0-44.9, adult ([place]) 08/05/2018  . Hyperlipidemia 03/16/2014  . Hypertension 03/16/2014  . OSA (obstructive sleep apnea) 03/16/2014   PCP:  Sofie Hartigan, MD Pharmacy:   [facility] [address] [place] [address] [place] [address] Phone: [phone] Fax: [phone]     Social Determinants of Health (SDOH) Interventions    Readmission Risk Interventions No flowsheet data found.

## 2018-11-25 NOTE — Anesthesia Preprocedure Evaluation (Addendum)
Anesthesia Evaluation  Patient identified by MRN, date of birth, ID band Patient awake    Reviewed: Allergy & Precautions, H&P , NPO status , Patient's Chart, lab work & pertinent test results  History of Anesthesia Complications Negative for: history of anesthetic complications  Airway Mallampati: III  TM Distance: >3 FB Neck ROM: full    Dental  (+) Teeth Intact   Pulmonary neg shortness of breath, sleep apnea and Continuous Positive Airway Pressure Ventilation , neg COPD, neg recent URI,           Cardiovascular hypertension, (-) angina     Neuro/Psych negative neurological ROS  negative psych ROS   GI/Hepatic negative GI ROS, Neg liver ROS,   Endo/Other  Morbid obesity  Renal/GU      Musculoskeletal  (+) Arthritis ,   Abdominal   Peds  Hematology negative hematology ROS (+)   Anesthesia Other Findings Past Medical History: No date: Arthritis No date: Hypertension No date: Sleep apnea     Comment:  Use CPAP  Past Surgical History: 2005: BACK SURGERY     Comment:  Dr. Mauri Pole, [facility] 12/01/2015: CARPAL TUNNEL RELEASE; Right     Comment:  Procedure: CARPAL TUNNEL RELEASE;  Surgeon: Dereck Leep, MD;  Location: [facility] ORS;  Service: Orthopedics;                Laterality: Right;     Reproductive/Obstetrics negative OB ROS                           Anesthesia Physical Anesthesia Plan  ASA: III  Anesthesia Plan: Spinal   Post-op Pain Management:    Induction:   PONV Risk Score and Plan: Propofol infusion  Airway Management Planned: Natural Airway and Simple Face Mask  Additional Equipment:   Intra-op Plan:   Post-operative Plan:   Informed Consent: I have reviewed the patients History and Physical, chart, labs and discussed the procedure including the risks, benefits and alternatives for the proposed anesthesia with the patient or authorized representative  who has indicated his/her understanding and acceptance.     Dental Advisory Given  Plan Discussed with: Anesthesiologist and CRNA  Anesthesia Plan Comments:        Anesthesia Quick Evaluation

## 2018-11-25 NOTE — Progress Notes (Signed)
Anticoagulation monitoring(Lovenox):  53yo  male ordered Lovenox 30 mg Q12h  There were no vitals filed for this visit. BMI 43.93   Lab Results  Component Value Date   CREATININE 0.96 11/19/2018   CREATININE 0.81 06/22/2015   CREATININE 0.89 01/13/2015   Estimated Creatinine Clearance: 133.2 mL/min (by C-G formula based on SCr of 0.96 mg/dL). Hemoglobin & Hematocrit     Component Value Date/Time   HGB 13.8 11/19/2018 1357   HCT 41.2 11/19/2018 1357     Per Protocol for Patient with estCrcl > 30 ml/min and BMI > 40, will transition to Lovenox 40 mg Q12h.

## 2018-11-25 NOTE — Discharge Instructions (Signed)
Instructions after Total Hip Replacement ° ° °  Shavelle Runkel P. Saniyah Mondesir, Jr., M.D.    ° Dept. of Orthopaedics & Sports Medicine ° [facility] ° [address] ° [place], [place]  [address] ° Phone: [phone]   Fax: [phone] ° °  °DIET: °• Drink plenty of non-alcoholic fluids. °• Resume your normal diet. Include foods high in fiber. ° °ACTIVITY:  °• You may use crutches or a walker with weight-bearing as tolerated, unless instructed otherwise. °• You may be weaned off of the walker or crutches by your Physical Therapist.  °• Do NOT reach below the level of your knees or cross your legs until allowed.    °• Continue doing gentle exercises. Exercising will reduce the pain and swelling, increase motion, and prevent muscle weakness.   °• Please continue to use the TED compression stockings for 6 weeks. You may remove the stockings at night, but should reapply them in the morning. °• Do not drive or operate any equipment until instructed. ° °WOUND CARE:  °• Continue to use ice packs periodically to reduce pain and swelling. °• Keep the incision clean and dry. °• You may bathe or shower after the staples are removed at the first office visit following surgery. ° °MEDICATIONS: °• You may resume your regular medications. °• Please take the pain medication as prescribed on the medication. °• Do not take pain medication on an empty stomach. °• You have been given a prescription for a blood thinner to prevent blood clots. Please take the medication as instructed. (NOTE: After completing a 2 week course of Lovenox, take one Enteric-coated aspirin once a day.) °• Pain medications and iron supplements can cause constipation. Use a stool softener (Senokot or Colace) on a daily basis and a laxative (dulcolax or miralax) as needed. °• Do not drive or drink alcoholic beverages when taking pain medications. ° °CALL THE OFFICE FOR: °• Temperature above 101 degrees °• Excessive bleeding or drainage on the dressing. °• Excessive  swelling, coldness, or paleness of the toes. °• Persistent nausea and vomiting. ° °FOLLOW-UP:  °• You should have an appointment to return to the office in 6 weeks after surgery. °• Arrangements have been made for continuation of Physical Therapy (either home therapy or outpatient therapy). °  °

## 2018-11-25 NOTE — H&P (Signed)
The patient has been re-examined, and the chart reviewed, and there have been no interval changes to the documented history and physical.    The risks, benefits, and alternatives have been discussed at length. The patient expressed understanding of the risks benefits and agreed with plans for surgical intervention.  James P. Hooten, Jr. M.D.    

## 2018-11-25 NOTE — Op Note (Signed)
OPERATIVE NOTE  DATE OF SURGERY:  11/25/2018  PATIENT NAME:  Jack GardnerWilliam E Goodenow Jr.   DOB: 03/01/1966  MRN: [id number]  PRE-OPERATIVE DIAGNOSIS: Degenerative arthrosis of the left hip, primary  POST-OPERATIVE DIAGNOSIS:  Same  PROCEDURE:  Left total hip arthroplasty  SURGEON:  Jena GaussJames P Amari Burnsworth, Jr. M.D.  ASSISTANT: Van ClinesJon Wolfe, PA (present and scrubbed throughout the case, critical for assistance with exposure, retraction, instrumentation, and closure)  ANESTHESIA: spinal  ESTIMATED BLOOD LOSS: 250 mL  FLUIDS REPLACED: 2000 mL of crystalloid  DRAINS: 2 medium drains to a Hemovac reservoir  IMPLANTS UTILIZED: DePuy 15 mm small stature AML femoral stem, 52 mm OD Pinnacle Gription Sector acetabular component, neutral Pinnacle Altrx polyethylene insert, and a 36 mm M-SPEC +1.5 mm hip ball  INDICATIONS FOR SURGERY: Jack GardnerWilliam E Frieze Jr. is a 53 y.o. year old male with a long history of progressive hip and groin  pain. X-rays demonstrated severe degenerative changes. The patient had not seen any significant improvement despite conservative nonsurgical intervention. After discussion of the risks and benefits of surgical intervention, the patient expressed understanding of the risks benefits and agree with plans for total hip arthroplasty.   The risks, benefits, and alternatives were discussed at length including but not limited to the risks of infection, bleeding, nerve injury, stiffness, blood clots, the need for revision surgery, limb length inequality, dislocation, cardiopulmonary complications, among others, and they were willing to proceed.  PROCEDURE IN DETAIL: The patient was brought into the operating room and, after adequate spinal anesthesia was achieved, the patient was placed in a right lateral decubitus position. Axillary roll was placed and all bony prominences were well-padded. The patient's left hip was cleaned and prepped with alcohol and DuraPrep and draped in the usual sterile fashion.  A "timeout" was performed as per usual protocol. A lateral curvilinear incision was made gently curving towards the posterior superior iliac spine. The IT band was incised in line with the skin incision and the fibers of the gluteus maximus were split in line. The piriformis tendon was identified, skeletonized, and incised at its insertion to the proximal femur and reflected posteriorly. A T type posterior capsulotomy was performed.  The gluteal sling was incised so as to better mobilize the proximal femur. The femoral head was then dislocated posteriorly. Inspection of the femoral head demonstrated severe degenerative changes with full-thickness loss of articular cartilage. The femoral neck cut was performed using an oscillating saw. The anterior capsule was elevated off of the femoral neck using a periosteal elevator. Attention was then directed to the acetabulum. The remnant of the labrum was excised using electrocautery. Inspection of the acetabulum also demonstrated significant degenerative changes. The acetabulum was reamed in sequential fashion up to a 51 mm diameter. Good punctate bleeding bone was encountered. A 52 mm Pinnacle Gription Sector acetabular component was positioned and impacted into place. Good scratch fit was appreciated. A neutral polyethylene trial was inserted.  Attention was then directed to the proximal femur. A hole for reaming of the proximal femoral canal was created using a high-speed burr. The femoral canal was reamed in sequential fashion up to a 14.5 mm diameter. This allowed for approximately 6 cm of scratch fit.  It was thus elected to ream up to a 15 mm diameter to allow for a line to line fit.  Serial broaches were inserted up to a 15 mm mall stature femoral broach. Calcar region was planed and a trial reduction was performed using a 36 mm hip ball  with a less 1.5 mm neck length. Good equalization of limb lengths and hip offset was appreciated and excellent stability was  noted both anteriorly and posteriorly. Trial components were removed. The acetabular shell was irrigated with copious amounts of normal saline with antibiotic solution and suctioned dry. A neutral Pinnacle Altrx polyethylene insert was positioned and impacted into place. Next, a 15 mm small stature AML femoral stem was positioned and impacted into place. Excellent scratch fit was appreciated. A trial reduction was again performed with a 36 mm hip ball with a +1.5 mm neck length. Again, good equalization of limb lengths was appreciated and excellent stability appreciated both anteriorly and posteriorly. The hip was then dislocated and the trial hip ball was removed. The Morse taper was cleaned and dried. A 36 mm M-SPEC hip ball with a +1.5 mm neck length was placed on the trunnion and impacted into place. The hip was then reduced and placed through range of motion. Excellent stability was appreciated both anteriorly and posteriorly.  The wound was irrigated with copious amounts of normal saline with antibiotic solution and suctioned dry. Good hemostasis was appreciated. The posterior capsulotomy was repaired using #5 Ethibond. Piriformis tendon was reapproximated to the undersurface of the gluteus medius tendon using #5 Ethibond.  The gluteal sling was repaired using #5 Ethibond.  Two medium drains were placed in the wound bed and brought out through separate stab incisions to be attached to a Hemovac reservoir. The IT band was reapproximated using interrupted sutures of #1 Vicryl. Subcutaneous tissue was approximated using first #0 Vicryl followed by #2-0 Vicryl. The skin was closed with skin staples.  The patient tolerated the procedure well and was transported to the recovery room in stable condition.   Marciano Sequin., M.D.

## 2018-11-25 NOTE — TOC Progression Note (Signed)
Transition of Care (TOC) - Progression Note    Patient Details  Name: Jack Campbell. MRN: [id number] Date of Birth: 12-May-1966  Transition of Care [facility]) CM/SW Contact  Tramon Crescenzo, Lenice Llamas Phone Number: [phone]  11/25/2018, 1:25 PM  Clinical Narrative: Lovenox price requested.           Expected Discharge Plan and Services                                                 Social Determinants of Health (SDOH) Interventions    Readmission Risk Interventions No flowsheet data found.

## 2018-11-25 NOTE — TOC Benefit Eligibility Note (Signed)
Transition of Care [facility]) Benefit Eligibility Note    Patient Details  Name: Jack Campbell. MRN: [id number] Date of Birth: 1965-07-14   Medication/Dose: Lovenox 90m once daily for 14 days.  Covered?: No  Prescription Coverage [facility] Person/Company/Phone Number:: [employer]icente M[employer] [phone]  Call reference number: [id number]  Prior Approval: Yes(PA required for name brand: [id number]  Deductible: (Deductible does not apply to Tier 2 medications.  Out of pocket max is $6,850, of which $68.62 met as of time of call.)  Additional Notes: Generic Enoxaparin covered with no PA required.  Considered Tier 2.  Estimated copay $25.Dannette BarbaraPhone Number: [phone] [phone]/22/2020, 2:47 PM

## 2018-11-25 NOTE — Progress Notes (Signed)
PT Cancellation Note  Patient Details Name: Jack Campbell. MRN: [id number] DOB: Oct 20, 1965   Cancelled Treatment:    Reason Eval/Treat Not Completed: Fatigue/lethargy limiting ability to participate(Patient consult received and reviewed. Patient asleep upon evaluation attempt. Attempted verbal stimuli, sternal rub, etc for 5 minutes with patient not stirring/waking. Will attempt again at later time/date.)  Janna Arch, PT, DPT   11/25/2018, 4:48 PM

## 2018-11-25 NOTE — Anesthesia Procedure Notes (Signed)
Spinal  Patient location during procedure: OR Start time: 11/25/2018 7:22 AM End time: 11/25/2018 7:28 AM Staffing Anesthesiologist: Durenda Hurt, MD Resident/CRNA: Gentry Fitz, CRNA Performed: resident/CRNA  Preanesthetic Checklist Completed: patient identified, site marked, surgical consent, pre-op evaluation, timeout performed, IV checked, risks and benefits discussed and monitors and equipment checked Spinal Block Patient position: sitting Prep: Betadine Patient monitoring: heart rate, continuous pulse ox, blood pressure and cardiac monitor Approach: midline Location: L4-5 Injection technique: single-shot Needle Needle type: Whitacre and Introducer  Needle gauge: 24 G Needle length: 9 cm Additional Notes Negative paresthesia. Negative blood return. Positive free-flowing CSF. Patient tolerated procedure well, without complications.

## 2018-11-26 ENCOUNTER — Encounter: Payer: Self-pay | Admitting: Orthopedic Surgery

## 2018-11-26 LAB — BASIC METABOLIC PANEL
Anion gap: 13 (ref 5–15)
BUN: 21 mg/dL — ABNORMAL HIGH (ref 6–20)
CO2: 27 mmol/L (ref 22–32)
Calcium: 8.4 mg/dL — ABNORMAL LOW (ref 8.9–10.3)
Chloride: 95 mmol/L — ABNORMAL LOW (ref 98–111)
Creatinine, Ser: 1 mg/dL (ref 0.61–1.24)
GFR calc Af Amer: >60 mL/min (ref >60)
GFR calc non Af Amer: >60 mL/min (ref >60)
Glucose, Bld: 142 mg/dL — ABNORMAL HIGH (ref 70–99)
Potassium: 3.9 mmol/L (ref 3.5–5.1)
Sodium: 135 mmol/L (ref 135–145)

## 2018-11-26 LAB — GLUCOSE, CAPILLARY: Glucose-Capillary: 137 mg/dL — ABNORMAL HIGH (ref 70–99)

## 2018-11-26 MED ORDER — ENOXAPARIN SODIUM 40 MG/0.4ML ~~LOC~~ SOLN: 40.0000 mg | SUBCUTANEOUS | 0 refills | Status: AC

## 2018-11-26 NOTE — Progress Notes (Signed)
   Subjective: 1 Day Post-Op Procedure(s) (LRB): TOTAL HIP ARTHROPLASTY LEFT (Left) Patient reports pain as 6 on 0-10 scale and states that he is just very sore.   Patient is well, and has had no acute complaints or problems We will start therapy today.  Plan is to go Home after hospital stay. no nausea and no vomiting Patient denies any chest pains or shortness of breath. Appears to be resting comfortably. Voicing no complaints other than the soreness Denies any numbness, tingling or burning sensation to the lower extremity  Objective: Vital signs in last 24 hours: Temp:  [97.4 F (36.3 C)-98.5 F (36.9 C)] 97.8 F (36.6 C) (06/23 0359) Pulse Rate:  [58-95] 84 (06/23 0359) Resp:  [13-20] 19 (06/23 0359) BP: (67-150)/(42-99) 133/81 (06/23 0359) SpO2:  [92 %-100 %] 98 % (06/23 0359) Weight:  [142 kg] 142 kg (06/22 1653) well approximated incision Heels are non tender and elevated off the bed using rolled towels Intake/Output from previous day: 06/22 0701 - 06/23 0700 In: 2605.3 [P.O.:240; I.V.:2117.9; IV Piggyback:247.4] Out: 4270 [Urine:3700; Drains:320; Blood:250] Intake/Output this shift: No intake/output data recorded.  No results for input(s): HGB in the last 72 hours. No results for input(s): WBC, RBC, HCT, PLT in the last 72 hours. Recent Labs    11/26/18 0505  NA 135  K 3.9  CL 95*  CO2 27  BUN 21*  CREATININE 1.00  GLUCOSE 142*  CALCIUM 8.4*   No results for input(s): LABPT, INR in the last 72 hours.  EXAM General - Patient is Alert, Appropriate and Oriented Extremity - Neurologically intact Neurovascular intact Sensation intact distally Intact pulses distally Dorsiflexion/Plantar flexion intact No cellulitis present Compartment soft Dressing - scant drainage Motor Function - intact, moving foot and toes well on exam.    Past Medical History:  Diagnosis Date  . Arthritis   . Hypertension   . Sleep apnea    Use CPAP    Assessment/Plan: 1  Day Post-Op Procedure(s) (LRB): TOTAL HIP ARTHROPLASTY LEFT (Left) Active Problems:   H/O total hip arthroplasty  Estimated body mass index is 43.66 kg/m as calculated from the following:   Height as of this encounter: '5\' 11"'$  (1.803 m).   Weight as of this encounter: 142 kg. Advance diet Up with therapy D/C IV fluids Plan for discharge tomorrow Discharge home with home health  Labs: Met be shows slight increase in BUN with a glucose of 142 otherwise met B panel within normal limits. DVT Prophylaxis - Lovenox, Foot Pumps and TED hose Weight-Bearing as tolerated to left leg D/C O2 and Pulse OX and try on Room Air Begin working on bowel movement  [name] R. [place] 11/26/2018, 7:26 AM

## 2018-11-26 NOTE — Discharge Summary (Signed)
Physician Discharge Summary  Patient ID: Jack Campbell Surles Jr. MRN: [id number] DOB/AGE: 51/07/1965 52 y.o.  Admit date: 11/25/2018 Discharge date: 11/27/2018  Admission Diagnoses:  PRIMARY OSTEOARTHRITIS OF LEFT HIP   Discharge Diagnoses: Patient Active Problem List   Diagnosis Date Noted  . H/O total hip arthroplasty 11/25/2018  . Primary osteoarthritis of left hip 08/11/2018  . BMI 40.0-44.9, adult (HCC) 08/05/2018  . Hyperlipidemia 03/16/2014  . Hypertension 03/16/2014  . OSA (obstructive sleep apnea) 03/16/2014    Past Medical History:  Diagnosis Date  . Arthritis   . Hypertension   . Sleep apnea    Use CPAP     Transfusion: No transfusions during this admission   Consultants (if any): None  Discharged Condition: Improved  Hospital Course: Jack Campbell [name] is an 53 y.o. male who was admitted 11/25/2018 with a diagnosis of degenerative arthrosis left hip and went to the operating room on 11/25/2018 and underwent the above named procedures.    Surgeries:Procedure(s): TOTAL HIP ARTHROPLASTY LEFT on 11/25/2018  PRE-OPERATIVE DIAGNOSIS: Degenerative arthrosis of the left hip, primary  POST-OPERATIVE DIAGNOSIS:  Same  PROCEDURE:  Left total hip arthroplasty  SURGEON:  Jena GaussJames P Hooten, Jr. M.D.  ASSISTANT: Van ClinesJon Jaicee Michelotti, PA (present and scrubbed throughout the case, critical for assistance with exposure, retraction, instrumentation, and closure)  ANESTHESIA: spinal  ESTIMATED BLOOD LOSS: 250 mL  FLUIDS REPLACED: 2000 mL of crystalloid  DRAINS: 2 medium drains to a Hemovac reservoir  IMPLANTS UTILIZED: DePuy 15 mm small stature AML femoral stem, 52 mm OD Pinnacle Gription Sector acetabular component, neutral Pinnacle Altrx polyethylene insert, and a 36 mm M-SPEC +1.5 mm hip ball  INDICATIONS FOR SURGERY: Jack Campbell Arlotta Jr. is a 53 y.o. year old male with a long history of progressive hip and groin  pain. X-rays demonstrated severe degenerative changes. The  patient had not seen any significant improvement despite conservative nonsurgical intervention. After discussion of the risks and benefits of surgical intervention, the patient expressed understanding of the risks benefits and agree with plans for total hip arthroplasty.   The risks, benefits, and alternatives were discussed at length including but not limited to the risks of infection, bleeding, nerve injury, stiffness, blood clots, the need for revision surgery, limb length inequality, dislocation, cardiopulmonary complications, among others, and they were willing to proceed. Patient tolerated the surgery well. No complications .Patient was taken to PACU where she was stabilized and then transferred to the orthopedic floor.  Patient started on Lovenox 30 mg q 12 hrs. Foot pumps applied bilaterally at 80 mm hgb. Heels elevated off bed with rolled towels. No evidence of DVT. Calves non tender. Negative Homan. Physical therapy started on day #1 for gait training and transfer with OT starting on  day #1 for ADL and assisted devices. Patient has done well with therapy. Ambulated 200 feet upon being discharged.  Was able to ascend and descend 4 steps safely and independently  Patient's IV And Foley were discontinued on day #1 with Hemovac being discontinued on day #2. Dressing was changed on day 2 prior to patient being discharged   He was given perioperative antibiotics:  Anti-infectives (From admission, onward)   Start     Dose/Rate Route Frequency Ordered Stop   11/25/18 1400  ceFAZolin (ANCEF) IVPB 2g/100 mL premix     2 g 200 mL/hr over 30 Minutes Intravenous Every 6 hours 11/25/18 1309 11/26/18 1359   11/25/18 0600  ceFAZolin (ANCEF) 3 g in dextrose 5 % 50 mL  IVPB     3 g 100 mL/hr over 30 Minutes Intravenous On call to O.R. 11/24/18 2226 11/25/18 7591    .  He was fitted with AV 1 compression foot pump devices, instructed on heel pumps, early ambulation, and fitted with TED stockings  bilaterally for DVT prophylaxis.  He benefited maximally from the hospital stay and there were no complications.    Recent vital signs:  Vitals:   11/25/18 2327 11/26/18 0359  BP: (!) 141/84 133/81  Pulse: 90 84  Resp: 18 19  Temp: 98.5 F (36.9 C) 97.8 F (36.6 C)  SpO2: 97% 98%    Recent laboratory studies:  Lab Results  Component Value Date   HGB 13.8 11/19/2018   HGB 13.3 12/01/2015   HGB 13.8 11/23/2015   Lab Results  Component Value Date   WBC 7.0 11/19/2018   PLT 276 11/19/2018   Lab Results  Component Value Date   INR 1.0 11/19/2018   Lab Results  Component Value Date   NA 135 11/26/2018   K 3.9 11/26/2018   CL 95 (L) 11/26/2018   CO2 27 11/26/2018   BUN 21 (H) 11/26/2018   CREATININE 1.00 11/26/2018   GLUCOSE 142 (H) 11/26/2018    Discharge Medications:   Allergies as of 11/26/2018   No Known Allergies     Medication List    STOP taking these medications   naproxen sodium 220 MG tablet Commonly known as: ALEVE     TAKE these medications   amLODipine-olmesartan 10-40 MG tablet Commonly known as: AZOR Take 1 tablet by mouth daily.   chlorthalidone 25 MG tablet Commonly known as: HYGROTON Take 25 mg by mouth daily.   enoxaparin 40 MG/0.4ML injection Commonly known as: LOVENOX Inject 0.4 mLs (40 mg total) into the skin daily for 14 days. Start taking on: November 28, 2018   metoprolol tartrate 100 MG tablet Commonly known as: LOPRESSOR Take 100 mg by mouth 2 (two) times daily.   oxymetazoline 0.05 % nasal spray Commonly known as: AFRIN Place 1 spray into both nostrils at bedtime.   potassium chloride SA 20 MEQ tablet Commonly known as: K-DUR Take 1 tablet by mouth 2 (two) times a day.   spironolactone 25 MG tablet Commonly known as: ALDACTONE Take 25 mg by mouth daily.            Durable Medical Equipment  (From admission, onward)         Start     Ordered   11/25/18 1310  DME Walker rolling  Once    Question:  Patient  needs a walker to treat with the following condition  Answer:  S/P total hip arthroplasty   11/25/18 1309   11/25/18 1310  DME Bedside commode  Once    Question:  Patient needs a bedside commode to treat with the following condition  Answer:  S/P total hip arthroplasty   11/25/18 1309          Diagnostic Studies: Dg Hip Port Unilat With Pelvis 1v Left  Result Date: 11/25/2018 CLINICAL DATA:  Total left hip replacement. EXAM: DG HIP (WITH OR WITHOUT PELVIS) 1V PORT LEFT COMPARISON:  MRI 11/02/2017. FINDINGS: Total left hip replacement. Hardware intact. Anatomic alignment. Degenerative change right hip. 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 Electronically Signed   By: Petrey   On: 11/25/2018 13:27    Disposition:   Discharge Instructions    Increase activity slowly   Complete by: As directed  Follow-up Information    Hooten, Illene LabradorJames P, MD On 01/02/2019.   Specialty: Orthopedic Surgery Why: at 2:15pm Contact information: [address] [facility] [place] [address] [phone]            Signed: Tera PartridgeJon R Jakyiah Briones 11/26/2018, 7:39 AM

## 2018-11-26 NOTE — Progress Notes (Signed)
Physical Therapy Treatment Patient Details Name: Jack GardnerWilliam E Helling Campbell. MRN: [id number] DOB: 11/13/1965 Today's Date: 11/26/2018    History of Present Illness Pt is a 53 yo male s/p elective L THA with PMHx including sleep apnea, arthritis, and HTN.    PT Comments    Pt presents with deficits in strength, transfers, mobility, gait, and activity tolerance but made good progress towards goals this session.  Pt required only CGA with cues for sequencing during transfer training.  Pt was able to amb 2 x 60' this session with improved cadence and confidence with no increased pain reported and with SpO2 and HR WNL.  Pt will benefit from HHPT services upon discharge to safely address above deficits for decreased caregiver assistance and eventual return to PLOF.       Follow Up Recommendations  Home health PT     Equipment Recommendations  Rolling walker with 5" wheels;3in1 (PT)    Recommendations for Other Services       Precautions / Restrictions Precautions Precautions: Posterior Hip;Fall Precaution Booklet Issued: Yes (comment) Precaution Comments: Pt able to recall 3/3 hip precautions this session Restrictions Weight Bearing Restrictions: Yes LLE Weight Bearing: Weight bearing as tolerated    Mobility  Bed Mobility Overal bed mobility: Needs Assistance Bed Mobility: Supine to Sit     Supine to sit: Min assist     General bed mobility comments: NT this session, pt in recliner  Transfers Overall transfer level: Needs assistance Equipment used: Rolling walker (2 wheeled) Transfers: Sit to/from Stand Sit to Stand: Min guard         General transfer comment: Mod verbal cues for sequencing to maintain post hip precautions  Ambulation/Gait Ambulation/Gait assistance: Min guard Gait Distance (Feet): 60 Feet x 2 Assistive device: Rolling walker (2 wheeled) Gait Pattern/deviations: Step-to pattern;Decreased stance time - left;Antalgic Gait velocity: decreased   General  Gait Details: Antalgic gait on the LLE but overall steady without LOB or buckling; SpO2 and HR WNL during amb; 90 deg left turn training x 4 to prevent CKC L hip IR   Stairs             Wheelchair Mobility    Modified Rankin (Stroke Patients Only)       Balance Overall balance assessment: No apparent balance deficits (not formally assessed)                                          Cognition Arousal/Alertness: Awake/alert Behavior During Therapy: WFL for tasks assessed/performed Overall Cognitive Status: Within Functional Limits for tasks assessed                                        Exercises Total Joint Exercises Ankle Circles/Pumps: Strengthening;Both;10 reps;15 reps Quad Sets: Strengthening;Both;10 reps;15 reps Gluteal Sets: Strengthening;Both;10 reps;15 reps Towel Squeeze: Strengthening;Both;10 reps Hip ABduction/ADduction: AAROM;Left;5 reps Long Arc Quad: Strengthening;Both;10 reps;15 reps Knee Flexion: Strengthening;Both;10 reps;15 reps Marching in Standing: AROM;Both;Standing;10 reps Other Exercises Other Exercises: Posterior hip precaution education provided and reviewed during functional tasks Other Exercises: HEP education/review per handout Other Exercises: Car transfer training verbal/visual education using room chair to simulate car seat    General Comments        Pertinent Vitals/Pain Pain Assessment: 0-10 Pain Score: 7  Pain Location: L hip Pain  Descriptors / Indicators: Aching Pain Intervention(s): Monitored during session;Premedicated before session    Home Living Family/patient expects to be discharged to:: Private residence Living Arrangements: Spouse/significant other Available Help at Discharge: Family;Available 24 hours/day Type of Home: Apartment Home Access: Level entry   Home Layout: One level Home Equipment: Cane - single point      Prior Function Level of Independence: Independent       Comments: Pt Ind with amb mostly without an AD but does endorse occasional use of a SPC, no fall history, works FT in [employer] the involves frequent walking, Ind with ADLs   PT Goals (current goals can now be found in the care plan section) Acute Rehab PT Goals Patient Stated Goal: To get stronger and walk without pain PT Goal Formulation: With patient Time For Goal Achievement: 12/09/18 Potential to Achieve Goals: Good Progress towards PT goals: Progressing toward goals    Frequency    BID      PT Plan Current plan remains appropriate    Co-evaluation              AM-PAC PT "6 Clicks" Mobility   Outcome Measure  Help needed turning from your back to your side while in a flat bed without using bedrails?: A Little Help needed moving from lying on your back to sitting on the side of a flat bed without using bedrails?: A Little Help needed moving to and from a bed to a chair (including a wheelchair)?: A Little Help needed standing up from a chair using your arms (e.g., wheelchair or bedside chair)?: A Little Help needed to walk in hospital room?: A Little Help needed climbing 3-5 steps with a railing? : A Little 6 Click Score: 18    End of Session Equipment Utilized During Treatment: Gait belt Activity Tolerance: Patient tolerated treatment well Patient left: in chair;with nursing/sitter in room;with call bell/phone within reach;with SCD's reapplied Nurse Communication: Mobility status PT Visit Diagnosis: Muscle weakness (generalized) (M62.81);Other abnormalities of gait and mobility (R26.89)     Time: 1451-1530 PT Time Calculation (min) (ACUTE ONLY): 39 min  Charges:  $Gait Training: 8-22 mins $Therapeutic Exercise: 8-22 mins $Therapeutic Activity: 8-22 mins                     D. Scott Shene Maxfield PT, DPT 11/26/18, 4:43 PM

## 2018-11-26 NOTE — Anesthesia Postprocedure Evaluation (Signed)
Anesthesia Post Note  Patient: Jack Campbell.  Procedure(s) Performed: TOTAL HIP ARTHROPLASTY LEFT (Left Hip)  Patient location during evaluation: Nursing Unit Anesthesia Type: Spinal Level of consciousness: oriented and awake and alert Pain management: pain level controlled Vital Signs Assessment: post-procedure vital signs reviewed and stable Respiratory status: spontaneous breathing and respiratory function stable Cardiovascular status: blood pressure returned to baseline and stable Postop Assessment: no headache, no backache, no apparent nausea or vomiting and patient able to bend at knees Anesthetic complications: no     Last Vitals:  Vitals:   11/25/18 2327 11/26/18 0359  BP: (!) 141/84 133/81  Pulse: 90 84  Resp: 18 19  Temp: 36.9 C 36.6 C  SpO2: 97% 98%    Last Pain:  Vitals:   11/26/18 0648  TempSrc:   PainSc: 6                  Caryl Asp

## 2018-11-26 NOTE — Evaluation (Signed)
Physical Therapy Evaluation Patient Details Name: Jack GardnerWilliam E Pines Jr. MRN: [id number] DOB: 08/03/1965 Today's Date: 11/26/2018   History of Present Illness  Pt is a 53 yo male s/p elective L THA with PMHx including sleep apnea, arthritis, and HTN.  Clinical Impression  Pt presents with deficits in strength, transfers, mobility, gait, balance, and activity tolerance.  Pt required min A with bed mobility tasks and extra time and effort to stand from an elevated EOB.  Pt was able to amb 5' in the room before returning to sitting.  Pt education provided on hip precautions with good carryover during functional tasks.  Pt will benefit from HHPT services upon discharge to safely address above deficits for decreased caregiver assistance and eventual return to PLOF.      Follow Up Recommendations Home health PT    Equipment Recommendations  Rolling walker with 5" wheels;3in1 (PT)(Bariatric RW and BSC required)    Recommendations for Other Services       Precautions / Restrictions Precautions Precautions: Posterior Hip;Fall Precaution Booklet Issued: Yes (comment) Precaution Comments: pt able to recall 2/3 hip precautions at start of session, 3/3 at end of session Restrictions Weight Bearing Restrictions: Yes LLE Weight Bearing: Weight bearing as tolerated      Mobility  Bed Mobility Overal bed mobility: Needs Assistance Bed Mobility: Supine to Sit     Supine to sit: Min assist     General bed mobility comments: Min A for LLE out of bed and to come to full upright sitting.  Transfers Overall transfer level: Needs assistance Equipment used: Rolling walker (2 wheeled) Transfers: Sit to/from Stand Sit to Stand: Min guard         General transfer comment: Mod verbal cues for sequencing to maintain post hip precautions  Ambulation/Gait Ambulation/Gait assistance: Min guard Gait Distance (Feet): 5 Feet Assistive device: Rolling walker (2 wheeled) Gait Pattern/deviations:  Step-to pattern;Decreased stance time - left;Antalgic Gait velocity: decreased   General Gait Details: Antalgic gait on the LLE but overall steady without LOB or buckling  Stairs            Wheelchair Mobility    Modified Rankin (Stroke Patients Only)       Balance Overall balance assessment: Mild deficits observed, not formally tested Sitting-balance support: No upper extremity supported Sitting balance-Leahy Scale: Good     Standing balance support: Bilateral upper extremity supported Standing balance-Leahy Scale: Fair                               Pertinent Vitals/Pain Pain Assessment: 0-10 Pain Score: 6  Pain Location: L hip Pain Descriptors / Indicators: Aching Pain Intervention(s): Premedicated before session;Monitored during session    Home Living Family/patient expects to be discharged to:: Private residence Living Arrangements: Spouse/significant other Available Help at Discharge: Family;Available 24 hours/day Type of Home: Apartment Home Access: Level entry     Home Layout: One level Home Equipment: Cane - single point      Prior Function Level of Independence: Independent         Comments: Pt Ind with amb mostly without an AD but does endorse occasional use of a SPC, no fall history, works FT in [employer] the involves frequent walking, Ind with ADLs     Hand Dominance   Dominant Hand: Right    Extremity/Trunk Assessment   Upper Extremity Assessment Upper Extremity Assessment: Defer to OT evaluation    Lower Extremity Assessment Lower  Extremity Assessment: Generalized weakness;LLE deficits/detail LLE Deficits / Details: L hip flex <3/5 LLE: Unable to fully assess due to pain    Cervical / Trunk Assessment Cervical / Trunk Assessment: Normal  Communication   Communication: No difficulties  Cognition Arousal/Alertness: Awake/alert Behavior During Therapy: WFL for tasks assessed/performed Overall Cognitive Status: Within  Functional Limits for tasks assessed                                        General Comments      Exercises Total Joint Exercises Ankle Circles/Pumps: Strengthening;Both;10 reps;15 reps Quad Sets: Strengthening;Both;10 reps;15 reps Gluteal Sets: Strengthening;Both;10 reps;15 reps Hip ABduction/ADduction: AAROM;Left;5 reps Long Arc Quad: Strengthening;Both;10 reps;15 reps Knee Flexion: Strengthening;Both;10 reps;15 reps Marching in Standing: AROM;Both;5 reps;Standing Other Exercises Other Exercises: Posterior hip precaution education provided and reviewed during functional tasks Other Exercises: HEP education per handout   Assessment/Plan    PT Assessment Patient needs continued PT services  PT Problem List Decreased strength;Decreased activity tolerance;Decreased balance;Decreased mobility;Decreased knowledge of precautions       PT Treatment Interventions DME instruction;Gait training;Stair training;Functional mobility training;Therapeutic activities;Therapeutic exercise;Balance training;Patient/family education    PT Goals (Current goals can be found in the Care Plan section)  Acute Rehab PT Goals Patient Stated Goal: To get stronger and walk without pain PT Goal Formulation: With patient Time For Goal Achievement: 12/09/18 Potential to Achieve Goals: Good    Frequency BID   Barriers to discharge        Co-evaluation               AM-PAC PT "6 Clicks" Mobility  Outcome Measure Help needed turning from your back to your side while in a flat bed without using bedrails?: A Little Help needed moving from lying on your back to sitting on the side of a flat bed without using bedrails?: A Little Help needed moving to and from a bed to a chair (including a wheelchair)?: A Little Help needed standing up from a chair using your arms (e.g., wheelchair or bedside chair)?: A Little Help needed to walk in hospital room?: A Little Help needed climbing 3-5  steps with a railing? : A Little 6 Click Score: 18    End of Session Equipment Utilized During Treatment: Gait belt Activity Tolerance: Patient tolerated treatment well Patient left: in chair;with nursing/sitter in room;with call bell/phone within reach Nurse Communication: Mobility status PT Visit Diagnosis: Muscle weakness (generalized) (M62.81);Other abnormalities of gait and mobility (R26.89)    Time: 4782-9562 PT Time Calculation (min) (ACUTE ONLY): 36 min   Charges:   PT Evaluation $PT Eval Low Complexity: 1 Low PT Treatments $Therapeutic Exercise: 8-22 mins        D. Scott Thaddus Mcdowell PT, DPT 11/26/18, 2:00 PM

## 2018-11-26 NOTE — Evaluation (Signed)
Occupational Therapy Evaluation Patient Details Name: Jack GardnerWilliam E Segreto Jr. MRN: [id number] DOB: 02/28/1966 Today's Date: 11/26/2018    History of Present Illness 53yo male s/p L THA with PMHx including sleep apnea, arthritis, and HTN.   Clinical Impression   Pt seen for OT evaluation this date, POD#1 from above surgery. Pt was independent in all ADL prior to surgery (except donning socks/shoes which spouse did) and occasionally using SPC for mobility due to L hip pain/stiffness some mornings. Pt is eager to return to PLOF with less pain and improved safety and independence. Pt currently requires min-mod assist for LB dressing and bathing while in seated position due to pain and limited AROM of L hip. Pt able to recall 2/3 posterior total hip precautions at start of session and unable to verbalize how to implement during ADL and mobility. Pt instructed in posterior total hip precautions and how to implement, self care skills, falls prevention strategies, home/routines modifications, DME/AE for LB bathing and dressing tasks, compression stocking mgt strategies, and RW use in kitchen vs bathroom. At end of session, pt able to recall 3/3 posterior total hip precautions. Pt would benefit from additional instruction in self care skills and techniques to help maintain precautions with or without assistive devices to support recall and carryover prior to discharge. Do not anticipate skilled OT needs at discharge.     Follow Up Recommendations  No OT follow up    Equipment Recommendations  3 in 1 bedside commode;Other (comment)(bariatric BSC, reacher)    Recommendations for Other Services       Precautions / Restrictions Precautions Precautions: Posterior Hip;Fall Precaution Booklet Issued: No Precaution Comments: pt able to recall 2/3 hip precautions at start of session, 3/3 at end of session Restrictions Weight Bearing Restrictions: Yes LLE Weight Bearing: Weight bearing as tolerated       Mobility Bed Mobility               General bed mobility comments: deferred, pt up in recliner at start and end of OT session  Transfers Overall transfer level: Needs assistance Equipment used: Rolling walker (2 wheeled) Transfers: Sit to/from Stand Sit to Stand: Min guard;Min assist         General transfer comment: cues for hand placement, pain/stiffness limiting factor resulting in need for some minor assist    Balance Overall balance assessment: Needs assistance Sitting-balance support: No upper extremity supported Sitting balance-Leahy Scale: Good     Standing balance support: Bilateral upper extremity supported Standing balance-Leahy Scale: Fair                             ADL either performed or assessed with clinical judgement   ADL Overall ADL's : Needs assistance/impaired                                       General ADL Comments: Pt requires Min-Mod A for LB ADL tasks 2/2 precautions and decr ROM/strength in LLE. Spouse able to provide needed level of assist     Vision Baseline Vision/History: Wears glasses Wears Glasses: Reading only Patient Visual Report: No change from baseline       Perception     Praxis      Pertinent Vitals/Pain Pain Assessment: 0-10 Pain Score: 5  Pain Location: L hip Pain Descriptors / Indicators: Aching Pain Intervention(s): Limited activity within  patient's tolerance;Monitored during session;Premedicated before session;RN gave pain meds during session;Repositioned     Hand Dominance Right   Extremity/Trunk Assessment Upper Extremity Assessment Upper Extremity Assessment: Overall WFL for tasks assessed   Lower Extremity Assessment Lower Extremity Assessment: Defer to PT evaluation(expected post-op strength/ROM deficits in LLE)   Cervical / Trunk Assessment Cervical / Trunk Assessment: Normal   Communication Communication Communication: No difficulties   Cognition  Arousal/Alertness: Awake/alert Behavior During Therapy: WFL for tasks assessed/performed Overall Cognitive Status: Within Functional Limits for tasks assessed                                     General Comments       Exercises Other Exercises Other Exercises: Pt instructed in posterior THPs and how to implement during functional transfers and ADL tasks Other Exercises: Pt instructed in falls prevention, RW use in kitchen vs bathroom, home/routines modifications, AE/DME, and compression stocking mgt   Shoulder Instructions      Home Living Family/patient expects to be discharged to:: Private residence   Available Help at Discharge: Family;Available 24 hours/day Type of Home: Apartment Home Access: Level entry     Home Layout: One level     Bathroom Shower/Tub: Chief Strategy OfficerTub/shower unit   Bathroom Toilet: Standard     Home Equipment: Cane - single point          Prior Functioning/Environment          Comments: Pt indep, PRN SPC use when feeling more stiff in L hip, and required assist for socks/shoes from spouse due to pain. No falls.        OT Problem List: Decreased strength;Decreased range of motion;Decreased knowledge of use of DME or AE;Decreased knowledge of precautions;Impaired balance (sitting and/or standing);Pain      OT Treatment/Interventions: Self-care/ADL training;Therapeutic activities;Therapeutic exercise;DME and/or AE instruction;Patient/family education;Balance training    OT Goals(Current goals can be found in the care plan section) Acute Rehab OT Goals Patient Stated Goal: have less pain and go home to recover OT Goal Formulation: With patient Time For Goal Achievement: 12/10/18 Potential to Achieve Goals: Good ADL Goals Pt Will Perform Lower Body Dressing: with min guard assist;sit to/from stand;with adaptive equipment Pt Will Transfer to Toilet: with supervision;ambulating(BSC over toilet, LRAD for amb, maintaining post  THPs) Additional ADL Goal #1: Pt will independently instruct family in compression stocking mgt Additional ADL Goal #2: Pt will independently verbalize 100% of posterior total hip precautions and how to maintain during functional transfers and ADL tasks.  OT Frequency: Min 1X/week   Barriers to D/C:            Co-evaluation              AM-PAC OT "6 Clicks" Daily Activity     Outcome Measure Help from another person eating meals?: None Help from another person taking care of personal grooming?: None Help from another person toileting, which includes using toliet, bedpan, or urinal?: A Little Help from another person bathing (including washing, rinsing, drying)?: A Little Help from another person to put on and taking off regular upper body clothing?: None Help from another person to put on and taking off regular lower body clothing?: A Little 6 Click Score: 21   End of Session    Activity Tolerance: Patient tolerated treatment well Patient left: in chair;with call bell/phone within reach;with chair alarm set;with SCD's reapplied  OT Visit Diagnosis: Other  abnormalities of gait and mobility (R26.89);Pain Pain - Right/Left: Left Pain - part of body: Hip                Time: 0454-0981 OT Time Calculation (min): 25 min Charges:  OT General Charges $OT Visit: 1 Visit OT Evaluation $OT Eval Low Complexity: 1 Low OT Treatments $Self Care/Home Management : 8-22 mins  Jeni Salles, MPH, MS, OTR/L ascom [phone] 11/26/18, 11:28 AM

## 2018-11-27 LAB — BASIC METABOLIC PANEL
Anion gap: 8 (ref 5–15)
BUN: 24 mg/dL — ABNORMAL HIGH (ref 6–20)
CO2: 30 mmol/L (ref 22–32)
Calcium: 8 mg/dL — ABNORMAL LOW (ref 8.9–10.3)
Chloride: 95 mmol/L — ABNORMAL LOW (ref 98–111)
Creatinine, Ser: 1.18 mg/dL (ref 0.61–1.24)
GFR calc Af Amer: >60 mL/min (ref >60)
GFR calc non Af Amer: >60 mL/min (ref >60)
Glucose, Bld: 109 mg/dL — ABNORMAL HIGH (ref 70–99)
Potassium: 3.7 mmol/L (ref 3.5–5.1)
Sodium: 133 mmol/L — ABNORMAL LOW (ref 135–145)

## 2018-11-27 MED ORDER — MAGNESIUM CITRATE PO SOLN
1.0000 | Freq: Once | ORAL | Status: AC
  Administered 2018-11-27: 1 via ORAL
  Filled 2018-11-27: qty 296

## 2018-11-27 MED ORDER — TRAMADOL HCL 50 MG PO TABS: 50.0000 mg | ORAL_TABLET | Freq: Four times a day (QID) | ORAL | 0 refills | Status: AC | PRN

## 2018-11-27 MED ORDER — OXYCODONE HCL 5 MG PO TABS: 5.0000 mg | ORAL_TABLET | ORAL | 0 refills | Status: AC | PRN

## 2018-11-27 NOTE — Progress Notes (Signed)
   Subjective: 2 Days Post-Op Procedure(s) (LRB): TOTAL HIP ARTHROPLASTY LEFT (Left) Patient reports pain as 5 on 0-10 scale.   Patient is well, and has had no acute complaints or problems Patient did fair with therapy yesterday.  Therapist only got him up in the morning to the chair.  Was able to walk to his door of his room x2.  Still needs to walk around the nurses desk and do steps prior to being discharged Plan is to go Home after hospital stay. no nausea and no vomiting Patient denies any chest pains or shortness of breath. Patient resting very soundly and voicing no complaints  Objective: Vital signs in last 24 hours: Temp:  [97.8 F (36.6 C)-98.4 F (36.9 C)] 98.4 F (36.9 C) (06/24 0429) Pulse Rate:  [65-79] 65 (06/24 0429) Resp:  [18] 18 (06/24 0429) BP: (113-144)/(58-84) 121/84 (06/24 0429) SpO2:  [97 %-100 %] 97 % (06/24 0429) well approximated incision Heels are non tender and elevated off the bed using rolled towels Intake/Output from previous day: 06/23 0701 - 06/24 0700 In: 2550.4 [P.O.:600; I.V.:1197.8; IV Piggyback:752.6] Out: 2650 [Urine:2375; Drains:275] Intake/Output this shift: Total I/O In: -  Out: 850 [Urine:750; Drains:100]  No results for input(s): HGB in the last 72 hours. No results for input(s): WBC, RBC, HCT, PLT in the last 72 hours. Recent Labs    11/26/18 0505 11/27/18 0318  NA 135 133*  K 3.9 3.7  CL 95* 95*  CO2 27 30  BUN 21* 24*  CREATININE 1.00 1.18  GLUCOSE 142* 109*  CALCIUM 8.4* 8.0*   No results for input(s): LABPT, INR in the last 72 hours.  EXAM General - Patient is Alert, Appropriate and Oriented Extremity - Neurologically intact Neurovascular intact Sensation intact distally Intact pulses distally Dorsiflexion/Plantar flexion intact No cellulitis present Compartment soft Dressing - dressing C/D/I Motor Function - intact, moving foot and toes well on exam.    Past Medical History:  Diagnosis Date  . Arthritis    . Hypertension   . Sleep apnea    Use CPAP    Assessment/Plan: 2 Days Post-Op Procedure(s) (LRB): TOTAL HIP ARTHROPLASTY LEFT (Left) Active Problems:   H/O total hip arthroplasty  Estimated body mass index is 43.66 kg/m as calculated from the following:   Height as of this encounter: 5\' 11"  (1.803 m).   Weight as of this encounter: 142 kg. Up with therapy Discharge home with home health  Labs: Sodium 133 BUN 24 potassium still within normal levels DVT Prophylaxis - Lovenox, Foot Pumps and TED hose Weight-Bearing as tolerated to left leg Hemovac was discontinued today.  Into the drain appeared to be intact Patient needs to walk around the nurses desk, do steps and have a bowel movement prior to being discharged Please give the patient 2 extra honeycomb dressings to take home  [place]. [place] 11/27/2018, 7:00 AM

## 2018-11-27 NOTE — Progress Notes (Signed)
Notified Vance Peper, PA that patient still has not had BM for discharge. Patient has taken Courtenay but does not want to take suppository or enema at this time. Order received for Magnesium citrate one bottle once.

## 2018-11-27 NOTE — Progress Notes (Signed)
Physical Therapy Treatment Patient Details Name: Jack GardnerWilliam E Treptow Jr. MRN: [id number] DOB: 02/16/1966 Today's Date: 11/27/2018    History of Present Illness Pt is a 53 yo male s/p elective L THA with PMHx including sleep apnea, arthritis, and HTN.    PT Comments    Pt in recliner.  Stood with min a x 1 and increased time.  Some difficulty getting to full upright but needed no external assist.  Standing ROM prior to gait.  Ambulated 160' with walker and min guard.  He did c/o dizziness after about 6050' but stated it cleared with time.  After return to room, he stated he was again dizzy 5/10.  BP taken in sitting 103/49 P 64.  Pt stated he had pain medication and BP meds this am.  Pt stated he has a BP monitor at home and was encouraged to discuss with MD and RN regarding BP and medication parameters.  RN aware.     Follow Up Recommendations  Home health PT     Equipment Recommendations  Rolling walker with 5" wheels;3in1 (PT)    Recommendations for Other Services       Precautions / Restrictions Precautions Precautions: Posterior Hip;Fall Restrictions Weight Bearing Restrictions: Yes LLE Weight Bearing: Weight bearing as tolerated    Mobility  Bed Mobility               General bed mobility comments: NT this session, pt in recliner  Transfers Overall transfer level: Needs assistance Equipment used: Rolling walker (2 wheeled) Transfers: Sit to/from Stand Sit to Stand: Min guard            Ambulation/Gait Ambulation/Gait assistance: Min guard Gait Distance (Feet): 160 Feet Assistive device: Rolling walker (2 wheeled) Gait Pattern/deviations: Step-to pattern;Step-through pattern Gait velocity: decreased   General Gait Details: irregular step pattern at times but overall does well with no LOB or buckling.  Does report some dizzness during gait.   Stairs         General stair comments: Pt stated he does not have any stairs at home to navigate.   Wheelchair  Mobility    Modified Rankin (Stroke Patients Only)       Balance Overall balance assessment: No apparent balance deficits (not formally assessed) Sitting-balance support: No upper extremity supported Sitting balance-Leahy Scale: Good     Standing balance support: Bilateral upper extremity supported Standing balance-Leahy Scale: Fair                              Cognition Arousal/Alertness: Awake/alert Behavior During Therapy: WFL for tasks assessed/performed Overall Cognitive Status: Within Functional Limits for tasks assessed                                        Exercises Other Exercises Other Exercises: Posterior hip precaution education provided and reviewed during functional tasks    General Comments        Pertinent Vitals/Pain Pain Assessment: 0-10 Pain Score: 5  Pain Location: L hip Pain Descriptors / Indicators: Sore;Aching Pain Intervention(s): Limited activity within patient's tolerance;Monitored during session;Premedicated before session    Home Living                      Prior Function            PT Goals (current goals can  now be found in the care plan section) Progress towards PT goals: Progressing toward goals    Frequency    BID      PT Plan Current plan remains appropriate    Co-evaluation              AM-PAC PT "6 Clicks" Mobility   Outcome Measure  Help needed turning from your back to your side while in a flat bed without using bedrails?: A Little Help needed moving from lying on your back to sitting on the side of a flat bed without using bedrails?: A Little Help needed moving to and from a bed to a chair (including a wheelchair)?: A Little Help needed standing up from a chair using your arms (e.g., wheelchair or bedside chair)?: A Little Help needed to walk in hospital room?: A Little Help needed climbing 3-5 steps with a railing? : A Little 6 Click Score: 18    End of Session  Equipment Utilized During Treatment: Gait belt Activity Tolerance: Patient tolerated treatment well Patient left: in chair;with nursing/sitter in room;with call bell/phone within reach Nurse Communication: Other (comment)       Time: 2778-2423 PT Time Calculation (min) (ACUTE ONLY): 27 min  Charges:  $Gait Training: 8-22 mins $Therapeutic Exercise: 8-22 mins                     Chesley Noon, PTA 11/27/18, 9:55 AM

## 2018-11-27 NOTE — TOC Transition Note (Signed)
Transition of Care [facility]) - CM/SW Discharge Note   Patient Details  Name: Jack Campbell. MRN: [id number] Date of Birth: 03/06/1966  [facility]) CM/SW Contact:  Kamel Haven, Lenice Llamas Phone Number: [phone]  11/27/2018, 8:35 AM   Clinical Narrative: Patient will D/C home today with [facility]. Helene Kelp [facility] representative has been notified of D/C today. Patient's rolling walker and bedside commode have been delivered to his room. CSW made patient aware that he does not qualify for bariatric equipment because he does not meet the weight limit. Patient is okay with his current walker and bedside commode. Patient was notified of Lovenox price $25. Please reconsult if future social work needs arise. CSW signing off.     Final next level of care: [place] Barriers to Discharge: Barriers Resolved   Patient Goals and CMS Choice Patient states their goals for this hospitalization and ongoing recovery are:: Improve mobility CMS Medicare.gov Compare Post Acute Care list provided to:: (Patient's surgeon's office arraned home health with [facility].) Choice offered to / list presented to : (Patient's surgeon's office arranged home health with [facility].)  Discharge Placement                       Discharge Plan and Services In-house Referral: Clinical Social Work Discharge Planning Services: CM Consult Post Acute Care Choice: Home Health          DME Arranged: Bedside commode, Walker rolling DME Agency: [employer] Date DME Agency Contacted: 11/25/18 Time DME Agency Contacted: [phone] Representative spoke with at DME Agency: [place]: PT [place]: [facility] (formerly [employer]) Date [place]: 11/25/18 Time [place]: [place] Representative spoke with at [place]: [place] ([place]) Interventions     Readmission Risk Interventions No flowsheet data found.

## 2018-11-27 NOTE — Progress Notes (Signed)
Physical Therapy Treatment Patient Details Name: Jack Campbell. MRN: [id number] DOB: Oct 22, 1965 Today's Date: 11/27/2018    History of Present Illness Pt is a 53 yo male s/p elective L THA with PMHx including sleep apnea, arthritis, and HTN.    PT Comments    Awaiting BM for discharge.  Gait offered and accepted by pt to help facilitate BM.  Stood with min guard and overall stronger stand then ambulated around unit with walker and min guard.  Recliner follow available but pt reported no dizziness this session stating he just felt fatigued.  Remained in recliner after session.   Follow Up Recommendations  Home health PT     Equipment Recommendations  Rolling walker with 5" wheels;3in1 (PT)    Recommendations for Other Services       Precautions / Restrictions Precautions Precautions: Posterior Hip;Fall Restrictions Weight Bearing Restrictions: Yes LLE Weight Bearing: Weight bearing as tolerated    Mobility  Bed Mobility               General bed mobility comments: NT this session, pt in recliner  Transfers Overall transfer level: Needs assistance Equipment used: Rolling walker (2 wheeled) Transfers: Sit to/from Stand Sit to Stand: Min guard         General transfer comment: [place] standing this session.  Ambulation/Gait Ambulation/Gait assistance: Min guard Gait Distance (Feet): 200 Feet   Gait Pattern/deviations: Step-through pattern Gait velocity: decreased   General Gait Details: irregular step pattern at times but overall does well with no LOB or buckling.  Denies dizziness this session.  "fatigue"   Stairs         General stair comments: Pt stated he does not have any stairs at home to navigate.   Wheelchair Mobility    Modified Rankin (Stroke Patients Only)       Balance Overall balance assessment: Mild deficits observed, not formally tested Sitting-balance support: No upper extremity supported Sitting balance-Leahy Scale:  Good     Standing balance support: Bilateral upper extremity supported Standing balance-Leahy Scale: Fair                              Cognition Arousal/Alertness: Awake/alert Behavior During Therapy: WFL for tasks assessed/performed Overall Cognitive Status: Within Functional Limits for tasks assessed                                        Exercises      General Comments        Pertinent Vitals/Pain Pain Assessment: 0-10 Pain Score: 2  Pain Descriptors / Indicators: Sore;Aching Pain Intervention(s): Limited activity within patient's tolerance;Monitored during session;Repositioned    Home Living                      Prior Function            PT Goals (current goals can now be found in the care plan section) Progress towards PT goals: Progressing toward goals    Frequency    BID      PT Plan Current plan remains appropriate    Co-evaluation              AM-PAC PT "6 Clicks" Mobility   Outcome Measure  Help needed turning from your back to your side while in a flat bed without using bedrails?:  A Little Help needed moving from lying on your back to sitting on the side of a flat bed without using bedrails?: A Little Help needed moving to and from a bed to a chair (including a wheelchair)?: A Little Help needed standing up from a chair using your arms (e.g., wheelchair or bedside chair)?: A Little Help needed to walk in hospital room?: A Little Help needed climbing 3-5 steps with a railing? : A Little 6 Click Score: 18    End of Session Equipment Utilized During Treatment: Gait belt Activity Tolerance: Patient tolerated treatment well Patient left: in chair;with call bell/phone within reach;with chair alarm set         Time: 0315-0326 PT Time Calculation (min) (ACUTE ONLY): 11 min  Charges:  $Gait Training: 8-22 mins                    Danielle DessSarah Charmagne Buhl, PTA 11/27/18, 4:11 PM

## 2018-11-27 NOTE — Progress Notes (Signed)
Discharge instructions and prescriptions reviewed with patient. Verbalization of understanding received. Time allowed for questions. IV discontinued with difficulty. Two honeycomb dressings provided to patient. Patient awaiting wife's arrival for discharge.

## 2018-11-27 NOTE — Progress Notes (Signed)
Occupational Therapy Treatment Patient Details Name: Jack GardnerWilliam E Macleod Jr. MRN: [id number] DOB: 01/11/1966 Today's Date: 11/27/2018    History of present illness Pt is a 53 yo male s/p elective L THA with PMHx including sleep apnea, arthritis, and HTN.   OT comments  Pt seen for OT tx this date, focused on posterior THPs and how to maintain during functional transfers and ADL tasks. Pt able to recall 3/3 precautions and only required minimal cueing for how to implement during ADL tasks. Pt instructed in AE for LB dressing and able to return demo technique requiring CGA in standing to complete donning of shorts over hips. Pt reporting 5/10 pain. Pt progressing towards goals. Continues to benefit from skilled OT while in the hospital to maximize return to PLOF.    Follow Up Recommendations  No OT follow up    Equipment Recommendations  3 in 1 bedside commode;Other (comment)(bariatric BSC, reacher)    Recommendations for Other Services      Precautions / Restrictions Precautions Precautions: Posterior Hip;Fall Precaution Comments: Pt able to recall 3/3 hip precautions this session Restrictions Weight Bearing Restrictions: Yes LLE Weight Bearing: Weight bearing as tolerated       Mobility Bed Mobility               General bed mobility comments: NT this session, pt in recliner  Transfers Overall transfer level: Needs assistance Equipment used: Rolling walker (2 wheeled) Transfers: Sit to/from Stand Sit to Stand: Min guard              Balance Overall balance assessment: Mild deficits observed, not formally tested Sitting-balance support: No upper extremity supported Sitting balance-Leahy Scale: Good     Standing balance support: Bilateral upper extremity supported Standing balance-Leahy Scale: Fair                             ADL either performed or assessed with clinical judgement   ADL Overall ADL's : Needs assistance/impaired     Grooming:  Independent;Applying deodorant;Sitting           Upper Body Dressing : Sitting;Independent   Lower Body Dressing: Sit to/from stand;With adaptive equipment;Cueing for sequencing;Min guard Lower Body Dressing Details (indicate cue type and reason): pt instructed in reacher and pt able to return demo use to don shorts over feet with min cues for maintaining precautions, CGA in standing to complete donning over hips                     Vision Baseline Vision/History: Wears glasses Wears Glasses: Reading only Patient Visual Report: No change from baseline     Perception     Praxis      Cognition Arousal/Alertness: Awake/alert Behavior During Therapy: WFL for tasks assessed/performed Overall Cognitive Status: Within Functional Limits for tasks assessed                                          Exercises Other Exercises Other Exercises: Posterior hip precaution education provided and reviewed during functional tasks   Shoulder Instructions       General Comments      Pertinent Vitals/ Pain       Pain Assessment: 0-10 Pain Score: 5  Pain Location: L hip Pain Descriptors / Indicators: Sore;Aching Pain Intervention(s): Limited activity within patient's tolerance;Monitored during session;Repositioned  Home Living                                          Prior Functioning/Environment              Frequency  Min 1X/week        Progress Toward Goals  OT Goals(current goals can now be found in the care plan section)     Acute Rehab OT Goals Patient Stated Goal: To get stronger and walk without pain OT Goal Formulation: With patient Time For Goal Achievement: 12/10/18 Potential to Achieve Goals: Good  Plan Discharge plan remains appropriate;Frequency remains appropriate    Co-evaluation                 AM-PAC OT "6 Clicks" Daily Activity     Outcome Measure   Help from another person eating meals?:  None Help from another person taking care of personal grooming?: None Help from another person toileting, which includes using toliet, bedpan, or urinal?: A Little Help from another person bathing (including washing, rinsing, drying)?: A Little Help from another person to put on and taking off regular upper body clothing?: None Help from another person to put on and taking off regular lower body clothing?: A Little 6 Click Score: 21    End of Session Equipment Utilized During Treatment: Gait belt;Rolling walker  OT Visit Diagnosis: Other abnormalities of gait and mobility (R26.89);Pain Pain - Right/Left: Left Pain - part of body: Hip   Activity Tolerance Patient tolerated treatment well   Patient Left in chair;with call bell/phone within reach;with chair alarm set;with SCD's reapplied   Nurse Communication          Time: 3614-4315 OT Time Calculation (min): 16 min  Charges: OT General Charges $OT Visit: 1 Visit OT Treatments $Self Care/Home Management : 8-22 mins  Jeni Salles, MPH, MS, OTR/L ascom [phone] 11/27/18, 10:18 AM

## 2018-11-28 LAB — SURGICAL PATHOLOGY

## 2019-06-17 ENCOUNTER — Ambulatory Visit: Payer: BC Managed Care – PPO | Attending: Internal Medicine

## 2020-01-08 IMAGING — DX DG HIP (WITH OR WITHOUT PELVIS) 1V PORT LEFT
2 series · 2 of 2 positions shown · non-contrast
Comparison: MRI 11/02/2017.

CLINICAL DATA: Total left hip replacement.

EXAM:
DG HIP (WITH OR WITHOUT PELVIS) 1V PORT LEFT

[pelvis ap]
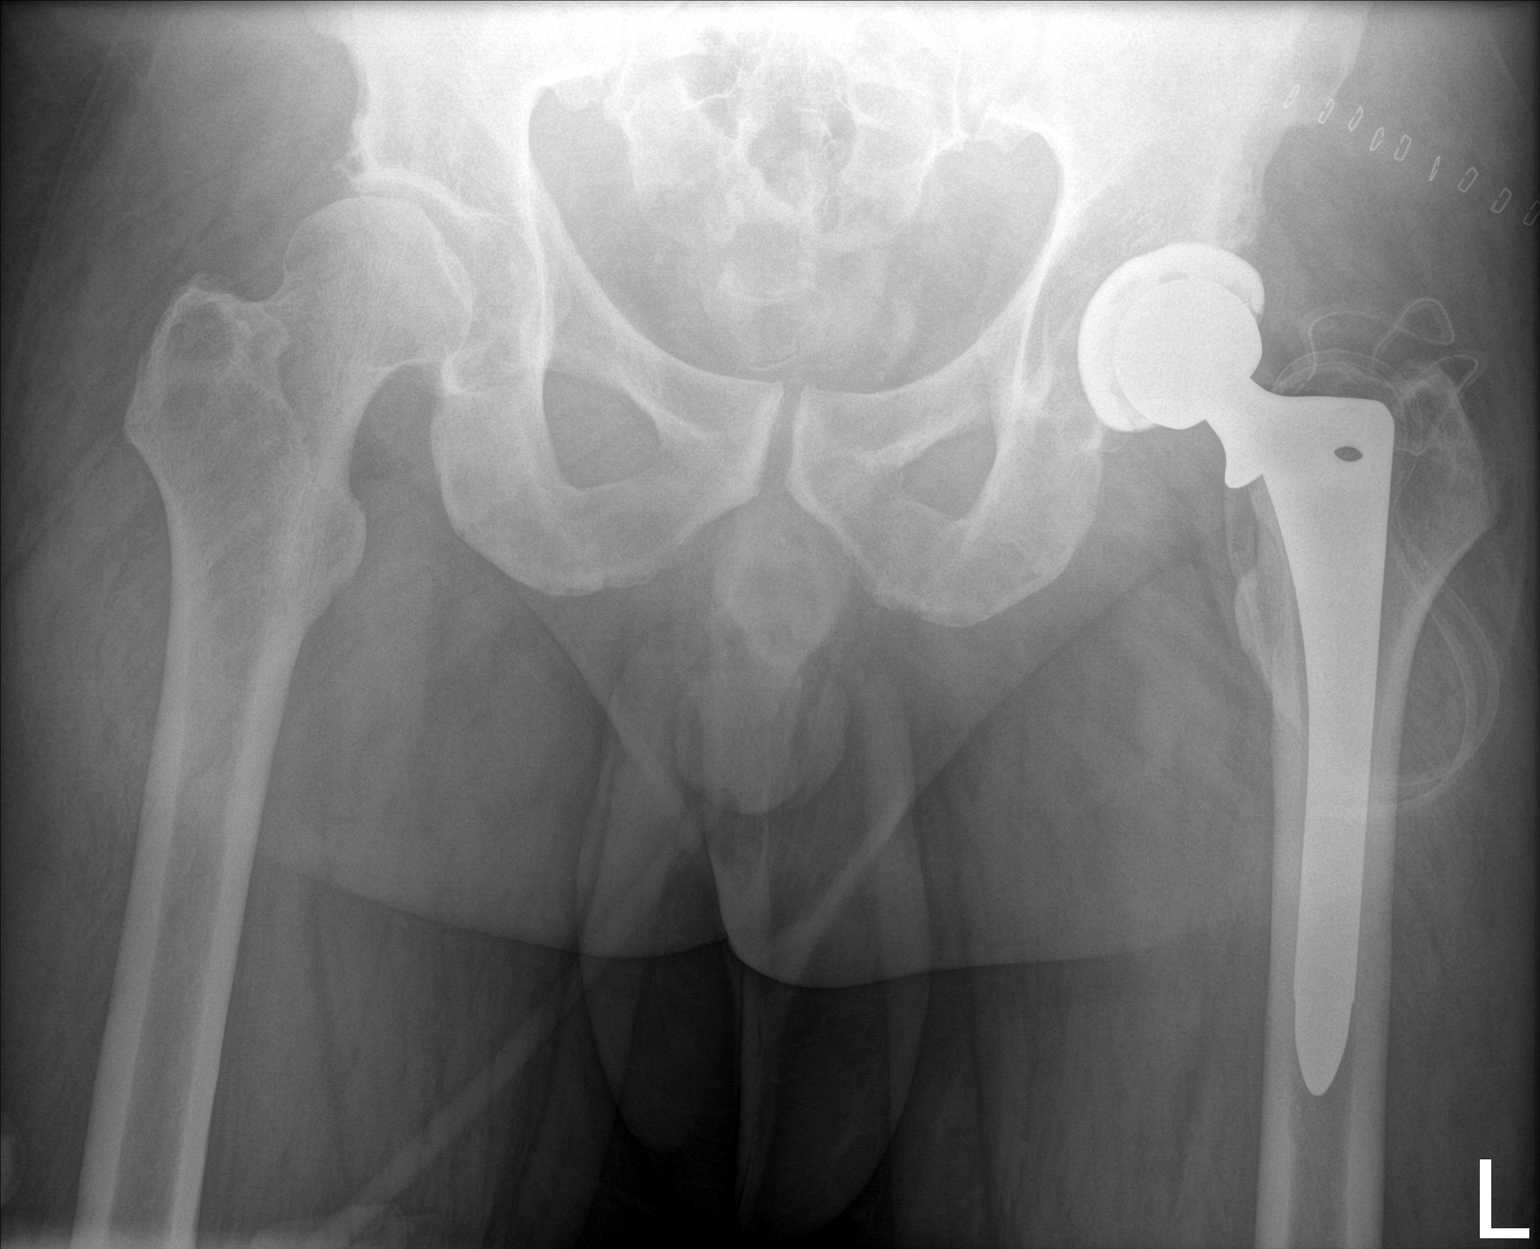

[hip lat]
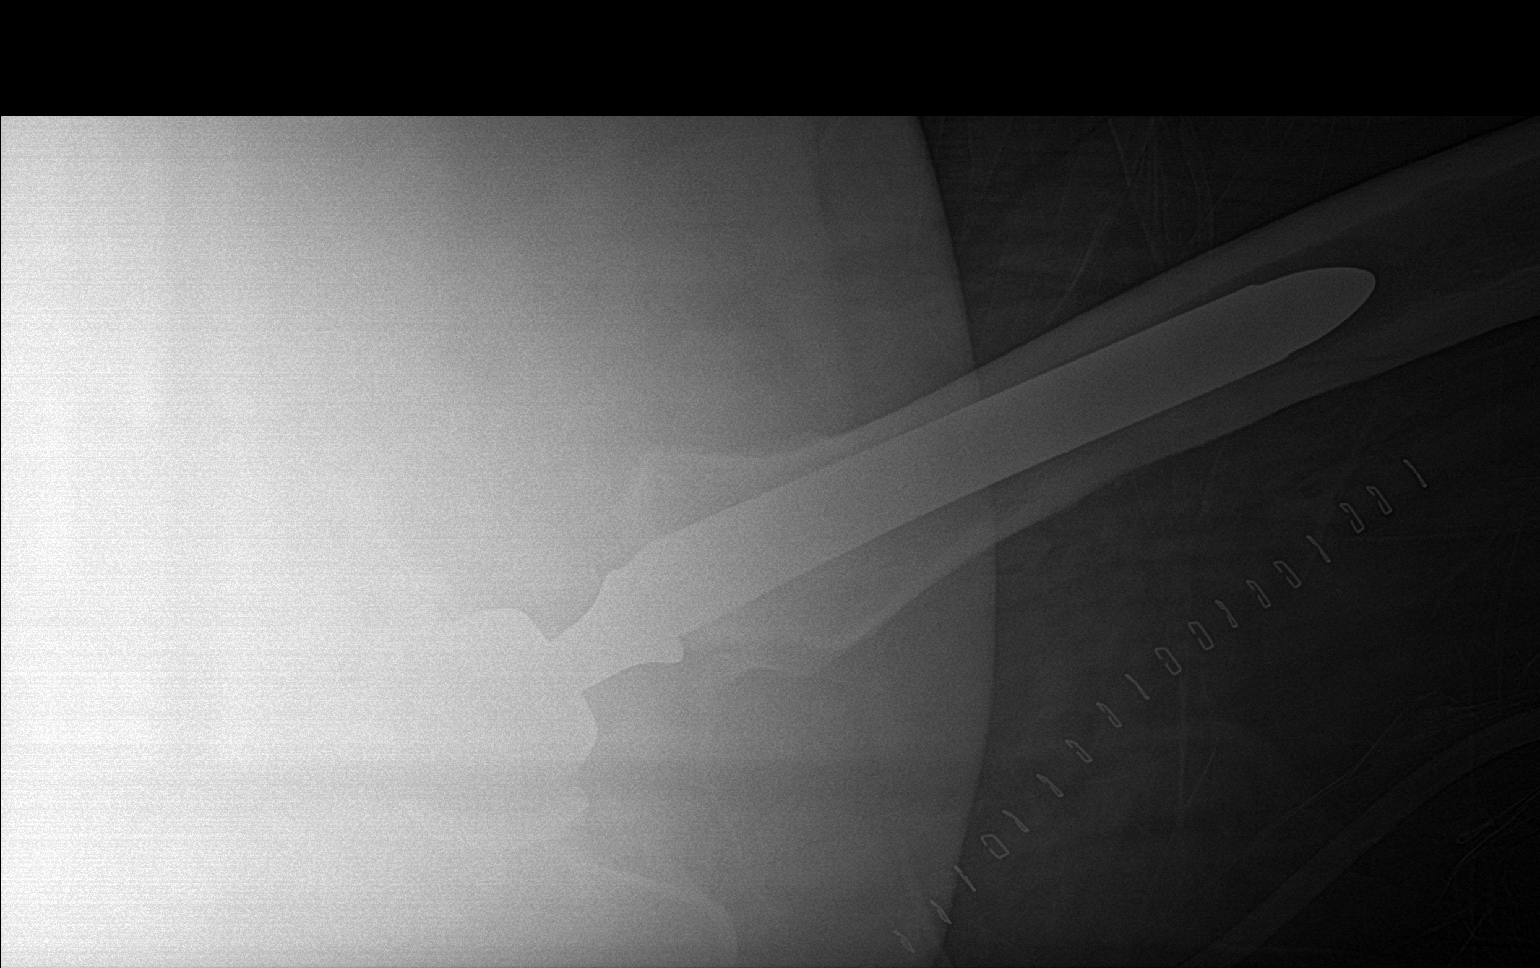

[2 of 2 positions shown; findings below may reference images not displayed]

FINDINGS: Total left hip replacement. Hardware intact. Anatomic alignment.
Degenerative change right hip.
IMPRESSION: Total left hip replacement with anatomic alignment.

## 2021-06-24 ENCOUNTER — Other Ambulatory Visit: Payer: Self-pay

## 2021-06-24 ENCOUNTER — Emergency Department
Admission: EM | Admit: 2021-06-24 | Discharge: 2021-06-24 | Disposition: A | Discharge status: Home / Self Care | Payer: BC Managed Care – PPO | Attending: Emergency Medicine | Admitting: Emergency Medicine

## 2021-06-24 DIAGNOSIS — M5442 Lumbago with sciatica, left side: Secondary | ICD-10-CM | POA: Insufficient documentation

## 2021-06-24 DIAGNOSIS — M545 Low back pain, unspecified: Secondary | ICD-10-CM | POA: Diagnosis present

## 2021-06-24 MED ORDER — PREDNISONE 10 MG (21) PO TBPK: ORAL_TABLET | ORAL | 0 refills | Status: AC

## 2021-06-24 MED ORDER — OXYCODONE-ACETAMINOPHEN 5-325 MG PO TABS
1.0000 | ORAL_TABLET | Freq: Once | ORAL | Status: AC
  Administered 2021-06-24: 1 via ORAL
  Filled 2021-06-24: qty 1

## 2021-06-24 MED ORDER — ONDANSETRON 4 MG PO TBDP
4.0000 mg | ORAL_TABLET | Freq: Once | ORAL | Status: AC
  Administered 2021-06-24: 4 mg via ORAL
  Filled 2021-06-24: qty 1

## 2021-06-24 MED ORDER — METHOCARBAMOL 500 MG PO TABS
500.0000 mg | ORAL_TABLET | Freq: Three times a day (TID) | ORAL | 0 refills | Status: AC | PRN
Start: 2021-06-24 — End: 2021-06-29

## 2021-06-24 MED ORDER — KETOROLAC TROMETHAMINE 30 MG/ML IJ SOLN
30.0000 mg | Freq: Once | INTRAMUSCULAR | Status: AC
  Administered 2021-06-24: 30 mg via INTRAMUSCULAR
  Filled 2021-06-24: qty 1

## 2021-06-24 NOTE — ED Provider Notes (Signed)
[facility] Provider Note  Patient Contact: 11:09 PM (approximate)   History   Back Pain   HPI  Jack Campbell. is a 56 y.o. male presents to the emergency department with left-sided low back pain with radiation of pain into the buttocks and down the left lower extremity.  Patient describes pain as a burning and tingling.  No bowel or bladder incontinence or saddle anesthesia.  No fever.  Patient has been able to ambulate.     Physical Exam   Triage Vital Signs: ED Triage Vitals  Enc Vitals Group     BP 06/24/21 2202 (!) 154/86     Pulse Rate 06/24/21 2202 78     Resp 06/24/21 2202 16     Temp 06/24/21 2202 98.7 F (37.1 C)     Temp Source 06/24/21 2202 Oral     SpO2 06/24/21 2202 100 %     Weight 06/24/21 2203 (!) 320 lb (145.2 kg)     Height 06/24/21 2203 5\' 11"  (1.803 m)     Head Circumference --      Peak Flow --      Pain Score 06/24/21 2203 8     Pain Loc --      Pain Edu? --      Excl. in [place]? --     Most recent vital signs: Vitals:   06/24/21 2202  BP: (!) 154/86  Pulse: 78  Resp: 16  Temp: 98.7 F (37.1 C)  SpO2: 100%     General: Alert and in no acute distress. Eyes:  PERRL. EOMI. Head: No acute traumatic findings ENT:      Ears:       Nose: No congestion/rhinnorhea.      Mouth/Throat: Mucous membranes are moist. Neck: No stridor. No cervical spine tenderness to palpation. Cardiovascular:  Good peripheral perfusion Respiratory: Normal respiratory effort without tachypnea or retractions. Lungs CTAB. Good air entry to the bases with no decreased or absent breath sounds. Gastrointestinal: Bowel sounds 4 quadrants. Soft and nontender to palpation. No guarding or rigidity. No palpable masses. No distention. No CVA tenderness. Musculoskeletal: Full range of motion to all extremities.  Patient has positive straight leg raise test, left. Neurologic:  No gross focal neurologic deficits are appreciated.  Skin:   No rash  noted Other:   ED Results / Procedures / Treatments   Labs (all labs ordered are listed, but only abnormal results are displayed) Labs Reviewed - No data to display      PROCEDURES:  Critical Care performed: No  Procedures   MEDICATIONS ORDERED IN ED: Medications  ketorolac (TORADOL) 30 MG/ML injection 30 mg (30 mg Intramuscular Given 06/24/21 2307)  oxyCODONE-acetaminophen (PERCOCET/ROXICET) 5-325 MG per tablet 1 tablet (1 tablet Oral Given 06/24/21 2307)  ondansetron (ZOFRAN-ODT) disintegrating tablet 4 mg (4 mg Oral Given 06/24/21 2307)     IMPRESSION / MDM / ASSESSMENT AND PLAN / ED COURSE  I reviewed the triage vital signs and the nursing notes.                              Differential diagnosis includes, but is not limited to, sciatica, disc extrusion, lumbar radiculopathy, lumbar strain...  Assessment and plan Lumbar Radiculopathy:  56 year old male presents to the emergency department with left-sided low back pain that radiates down the posterior aspect of the left lower extremity.  Patient was hypertensive at triage but vital signs  were otherwise reassuring.  He had a positive straight leg raise test on the left, increasing suspicion for lumbar radiculopathy.  Patient was given an injection of Toradol in the emergency department and a Percocet.  He was discharged with tapered prednisone and Robaxin.  Return precautions were given to return with new or worsening symptoms.  All patient questions were answered.      FINAL CLINICAL IMPRESSION(S) / ED DIAGNOSES   Final diagnoses:  Acute left-sided low back pain with left-sided sciatica     Rx / DC Orders   ED Discharge Orders          Ordered    predniSONE (STERAPRED UNI-PAK 21 TAB) 10 MG (21) TBPK tablet        06/24/21 2303    methocarbamol (ROBAXIN) 500 MG tablet  Every 8 hours PRN        06/24/21 2303             Note:  This document was prepared using Dragon voice recognition software and  may include unintentional dictation errors.   Vallarie Mare [place], PA-C 06/24/21 2312    Harvest Dark, MD 06/30/21 1319

## 2021-06-24 NOTE — Discharge Instructions (Addendum)
Take tapered steroid and Robaxin as directed. ?

## 2021-06-24 NOTE — ED Triage Notes (Signed)
Pt with pain in lower left back that radiates down left leg. Pt denies vomiting, fever. Pt appears in no acute distress.

## 2022-04-22 ENCOUNTER — Emergency Department
Admission: EM | Admit: 2022-04-22 | Discharge: 2022-04-22 | Disposition: A | Discharge status: Home / Self Care | Payer: BC Managed Care – PPO | Attending: Emergency Medicine | Admitting: Emergency Medicine

## 2022-04-22 ENCOUNTER — Emergency Department: Payer: BC Managed Care – PPO

## 2022-04-22 ENCOUNTER — Other Ambulatory Visit: Payer: Self-pay

## 2022-04-22 DIAGNOSIS — M79672 Pain in left foot: Secondary | ICD-10-CM | POA: Diagnosis not present

## 2022-04-22 DIAGNOSIS — I10 Essential (primary) hypertension: Secondary | ICD-10-CM | POA: Insufficient documentation

## 2022-04-22 LAB — CBC
HCT: 45 % (ref 39.0–52.0)
Hemoglobin: 15.5 g/dL (ref 13.0–17.0)
MCH: 28.4 pg (ref 26.0–34.0)
MCHC: 34.4 g/dL (ref 30.0–36.0)
MCV: 82.4 fL (ref 80.0–100.0)
Platelets: 287 10*3/uL (ref 150–400)
RBC: 5.46 MIL/uL (ref 4.22–5.81)
RDW: 12.8 % (ref 11.5–15.5)
WBC: 9.5 10*3/uL (ref 4.0–10.5)
nRBC: 0 % (ref 0.0–0.2)

## 2022-04-22 LAB — BASIC METABOLIC PANEL
Anion gap: 9 (ref 5–15)
BUN: 21 mg/dL — ABNORMAL HIGH (ref 6–20)
CO2: 29 mmol/L (ref 22–32)
Calcium: 9.8 mg/dL (ref 8.9–10.3)
Chloride: 101 mmol/L (ref 98–111)
Creatinine, Ser: 1.21 mg/dL (ref 0.61–1.24)
GFR, Estimated: >60 mL/min (ref >60)
Glucose, Bld: 127 mg/dL — ABNORMAL HIGH (ref 70–99)
Potassium: 3.7 mmol/L (ref 3.5–5.1)
Sodium: 139 mmol/L (ref 135–145)

## 2022-04-22 LAB — URIC ACID: Uric Acid, Serum: 7.2 mg/dL (ref 3.7–8.6)

## 2022-04-22 MED ORDER — CEPHALEXIN 500 MG PO CAPS
500.0000 mg | ORAL_CAPSULE | Freq: Once | ORAL | Status: AC
  Administered 2022-04-22: 500 mg via ORAL
  Filled 2022-04-22: qty 1

## 2022-04-22 MED ORDER — COLCHICINE 0.6 MG PO TABS
0.6000 mg | ORAL_TABLET | Freq: Once | ORAL | Status: AC
  Administered 2022-04-22: 0.6 mg via ORAL
  Filled 2022-04-22: qty 1

## 2022-04-22 MED ORDER — CEPHALEXIN 500 MG PO CAPS: 500.0000 mg | ORAL_CAPSULE | Freq: Four times a day (QID) | ORAL | 0 refills | Status: AC

## 2022-04-22 MED ORDER — COLCHICINE 0.6 MG PO TABS: 0.6000 mg | ORAL_TABLET | Freq: Two times a day (BID) | ORAL | 0 refills | Status: AC

## 2022-04-22 NOTE — Discharge Instructions (Addendum)
Take colchicine twice daily until pain resolves. Take Keflex 4 times daily for the next 7 days.

## 2022-04-22 NOTE — ED Provider Notes (Signed)
[facility] Provider Note  Patient Contact: 5:17 PM (approximate)   History   Foot Pain   HPI  Jack Campbell. is a 56 y.o. male presents to the emergency department with acute left foot pain for the past several days.  Patient states that he has a history of gout but always of the right foot.  Pain is mostly localized to the first metatarsal phalangeal joint.  Patient states that it is extremely painful and mildly erythematous and sensitive to the touch.  He states that he took a 6-day course of prednisone with no relief in symptoms.  He denies fever and chills.  No falls or mechanisms of trauma.  No history of diabetes or prior cellulitis.      Physical Exam   Triage Vital Signs: ED Triage Vitals  Enc Vitals Group     BP 04/22/22 1553 (!) 151/82     Pulse Rate 04/22/22 1553 73     Resp 04/22/22 1553 19     Temp 04/22/22 1553 98.4 F (36.9 C)     Temp Source 04/22/22 1553 Oral     SpO2 04/22/22 1553 96 %     Weight 04/22/22 1550 (!) 319 lb 10.7 oz (145 kg)     Height 04/22/22 1550 5\' 11"  (1.803 m)     Head Circumference --      Peak Flow --      Pain Score 04/22/22 1550 9     Pain Loc --      Pain Edu? --      Excl. in [place]? --     Most recent vital signs: Vitals:   04/22/22 1553  BP: (!) 151/82  Pulse: 73  Resp: 19  Temp: 98.4 F (36.9 C)  SpO2: 96%     General: Alert and in no acute distress. Eyes:  PERRL. EOMI. Head: No acute traumatic findings ENT:      Nose: No congestion/rhinnorhea.      Mouth/Throat: Mucous membranes are moist. Neck: No stridor. No cervical spine tenderness to palpation. Cardiovascular:  Good peripheral perfusion Respiratory: Normal respiratory effort without tachypnea or retractions. Lungs CTAB. Good air entry to the bases with no decreased or absent breath sounds. Gastrointestinal: Bowel sounds 4 quadrants. Soft and nontender to palpation. No guarding or rigidity. No palpable masses. No distention. No  CVA tenderness. Musculoskeletal: Full range of motion to all extremities.  Neurologic:  No gross focal neurologic deficits are appreciated.  Skin: Patient has mild erythema over the first MCP on the left.  Patient has mild swelling and tenderness to palpation. Other:   ED Results / Procedures / Treatments   Labs (all labs ordered are listed, but only abnormal results are displayed) Labs Reviewed  BASIC METABOLIC PANEL - Abnormal; Notable for the following components:      Result Value   Glucose, Bld 127 (*)    BUN 21 (*)    All other components within normal limits  CBC  URIC ACID        RADIOLOGY  I personally viewed and evaluated these images as part of my medical decision making, as well as reviewing the written report by the radiologist.  ED Provider Interpretation: No acute fracture or dislocation.    PROCEDURES:  Critical Care performed: No  Procedures   MEDICATIONS ORDERED IN ED: Medications  colchicine tablet 0.6 mg (has no administration in time range)  cephALEXin (KEFLEX) capsule 500 mg (has no administration in time range)  IMPRESSION / MDM / ASSESSMENT AND PLAN / ED COURSE  I reviewed the triage vital signs and the nursing notes.                              Assessment and plan Left foot pain 56 year old male presents to the emergency department with acute left foot pain.  Patient was hypertensive at triage but vital signs were otherwise reassuring.  On exam, patient was alert, active and nontoxic-appearing.  BMP, CBC and uric acid within range.  Patient has no acute abnormality on x-ray of the left foot the patient has small periarticular erosion that could be consistent with gout.  We will start patient on colchicine and also Keflex to cover patient for cellulitis although my suspicion for cellulitis is relatively low.  Return precautions were given to return with new or worsening symptoms.  All patient questions were answered.   FINAL  CLINICAL IMPRESSION(S) / ED DIAGNOSES   Final diagnoses:  Left foot pain     Rx / DC Orders   ED Discharge Orders          Ordered    cephALEXin (KEFLEX) 500 MG capsule  4 times daily        04/22/22 1715    colchicine 0.6 MG tablet  2 times daily        04/22/22 1715             Note:  This document was prepared using Dragon voice recognition software and may include unintentional dictation errors.   Pia Mau [place], PA-C 04/22/22 Arvella Merles, MD 04/22/22 1958

## 2022-04-22 NOTE — ED Triage Notes (Signed)
Pt reports pain to his left foot for several days. Pt states he has gout in his right foot and thought that is what it as but they only gave him prednisone and it hasn't helped. Pt reports they didn't check blood work or anything
